# Patient Record
Sex: Male | Born: 1980 | Race: Black or African American | Hispanic: No | Marital: Single | Smoking: Current every day smoker
Health system: Southern US, Community
[De-identification: ages and names within clinical notes are randomized; demographics above are authoritative.]

## PROBLEM LIST (undated history)

## (undated) DIAGNOSIS — I10 Essential (primary) hypertension: Secondary | ICD-10-CM

---

## 2016-12-29 ENCOUNTER — Other Ambulatory Visit: Payer: Self-pay

## 2016-12-29 ENCOUNTER — Emergency Department (HOSPITAL_BASED_OUTPATIENT_CLINIC_OR_DEPARTMENT_OTHER)
Admission: EM | Admit: 2016-12-29 | Discharge: 2016-12-29 | Disposition: A | Discharge status: Home / Self Care | Payer: Self-pay | Attending: Emergency Medicine | Admitting: Emergency Medicine

## 2016-12-29 ENCOUNTER — Encounter (HOSPITAL_BASED_OUTPATIENT_CLINIC_OR_DEPARTMENT_OTHER): Payer: Self-pay | Admitting: *Deleted

## 2016-12-29 ENCOUNTER — Emergency Department (HOSPITAL_BASED_OUTPATIENT_CLINIC_OR_DEPARTMENT_OTHER): Payer: Self-pay

## 2016-12-29 DIAGNOSIS — L84 Corns and callosities: Secondary | ICD-10-CM | POA: Insufficient documentation

## 2016-12-29 DIAGNOSIS — M79671 Pain in right foot: Secondary | ICD-10-CM

## 2016-12-29 NOTE — ED Triage Notes (Signed)
pt c/o planter right foot pain x 1 month

## 2016-12-29 NOTE — Discharge Instructions (Signed)
Take Tylenol 1000 mg 4 times a day as needed for 1 week. This is the maximum dose of Tylenol (acetaminophen) usually take from all sources. Please check other over-the-counter medications and prescriptions to ensure you are not taking other medications that contain acetaminophen.  You may also take ibuprofen 400 mg 6 times a day alternating with or at the same time as tylenol.  °

## 2016-12-30 NOTE — ED Provider Notes (Signed)
[facility] EMERGENCY DEPARTMENT Provider Note   CSN: [id number] Arrival date & time: 12/29/16  1417     History   Chief Complaint Chief Complaint  Patient presents with  . Foot Pain    HPI Hyacinth Meekerntonio Huxford is a 36 y.o. male.  HPI   Presents with right foot pain for one month. Is worse when wearing work boots. Worsening over last month. Hurts by big toe MTP and small toe.  Pain present in morning but also worsens througtout day.  Has not tried anything yet for pain.  No hx of DM. No fevers, no trauma.  History reviewed. No pertinent past medical history.  There are no active problems to display for this patient.   History reviewed. No pertinent surgical history.     Home Medications    Prior to Admission medications   Not on File    Family History No family history on file.  Social History Social History   Tobacco Use  . Smoking status: Not on file  Substance Use Topics  . Alcohol use: Not on file  . Drug use: Not on file     Allergies   Patient has no known allergies.   Review of Systems Review of Systems  Constitutional: Negative for fever.  Respiratory: Negative for cough.   Cardiovascular: Negative for chest pain.  Musculoskeletal: Positive for arthralgias.  Skin: Negative for rash and wound.     Physical Exam Updated Vital Signs BP 140/88 (BP Location: Left Arm)   Pulse 67   Temp 98 F (36.7 C)   Resp 16   Ht 5\' 9"  (1.753 m)   Wt 127 kg (280 lb)   SpO2 99%   BMI 41.35 kg/m   Physical Exam  Constitutional: He is oriented to person, place, and time. He appears well-developed and well-nourished. No distress.  HENT:  Head: Normocephalic and atraumatic.  Eyes: Conjunctivae and EOM are normal.  Neck: Normal range of motion.  Cardiovascular: Normal rate, regular rhythm and intact distal pulses.  Pulmonary/Chest: Effort normal. No respiratory distress.  Musculoskeletal: He exhibits no edema.  Tenderness and callus present  1st MTP and 5th MTP/pinky toe  Neurological: He is alert and oriented to person, place, and time.  Skin: Skin is warm and dry. He is not diaphoretic.  Nursing note and vitals reviewed.    ED Treatments / Results  Labs (all labs ordered are listed, but only abnormal results are displayed) Labs Reviewed - No data to display  EKG  EKG Interpretation None       Radiology Dg Foot Complete Right  Result Date: 12/29/2016 CLINICAL DATA:  36 year old male with right foot pain along the plantar and lateral aspects for 1 month. No known injury. EXAM: RIGHT FOOT COMPLETE - 3+ VIEW COMPARISON:  None. FINDINGS: There is no evidence of fracture or dislocation. There is mild calcaneal spurring. There is no evidence of arthropathy or other focal bone abnormality. Soft tissues are unremarkable. IMPRESSION: Mild calcaneal spurring without acute osseous abnormalities. Electronically Signed   By: Sande BrothersSerena  Chacko M.D.   On: 12/29/2016 15:22    Procedures Procedures (including critical care time)  Medications Ordered in ED Medications - No data to display   Initial Impression / Assessment and Plan / ED Course  I have reviewed the triage vital signs and the nursing notes.  Pertinent labs & imaging results that were available during my care of the patient were reviewed by me and considered in my medical decision making (  see chart for details).     36yo male presents with concern for right foot pain worsening over the last month. No sign of fracture on XR.  Exam consistent with callus on pinky toe, bunion formation 1st MTP. No sign of abscess, no cellulitis.  Recommend podiatry follow up, ibuprofen, tylenol, supportive inserts. Patient discharged in stable condition with understanding of reasons to return.   Final Clinical Impressions(s) / ED Diagnoses   Final diagnoses:  Right foot pain  Callus of foot, small toe and bunion at MTP    ED Discharge Orders    None       Alvira MondaySchlossman, Channie Bostick,  MD 12/30/16 1405

## 2017-05-09 ENCOUNTER — Emergency Department (HOSPITAL_BASED_OUTPATIENT_CLINIC_OR_DEPARTMENT_OTHER)
Admission: EM | Admit: 2017-05-09 | Discharge: 2017-05-09 | Disposition: A | Discharge status: Home / Self Care | Payer: Self-pay | Attending: Emergency Medicine | Admitting: Emergency Medicine

## 2017-05-09 ENCOUNTER — Encounter (HOSPITAL_BASED_OUTPATIENT_CLINIC_OR_DEPARTMENT_OTHER): Payer: Self-pay

## 2017-05-09 ENCOUNTER — Other Ambulatory Visit: Payer: Self-pay

## 2017-05-09 DIAGNOSIS — J029 Acute pharyngitis, unspecified: Secondary | ICD-10-CM | POA: Insufficient documentation

## 2017-05-09 DIAGNOSIS — B9789 Other viral agents as the cause of diseases classified elsewhere: Secondary | ICD-10-CM | POA: Insufficient documentation

## 2017-05-09 DIAGNOSIS — F17200 Nicotine dependence, unspecified, uncomplicated: Secondary | ICD-10-CM | POA: Insufficient documentation

## 2017-05-09 LAB — RAPID STREP SCREEN (MED CTR MEBANE ONLY): Streptococcus, Group A Screen (Direct): NEGATIVE

## 2017-05-09 MED ORDER — ACETAMINOPHEN 500 MG PO TABS: 500.0000 mg | ORAL_TABLET | Freq: Four times a day (QID) | ORAL | 0 refills | Status: DC | PRN

## 2017-05-09 MED ORDER — DEXAMETHASONE 6 MG PO TABS
10.0000 mg | ORAL_TABLET | Freq: Once | ORAL | Status: AC
  Administered 2017-05-09: 18:00:00 10 mg via ORAL
  Filled 2017-05-09: qty 1

## 2017-05-09 MED ORDER — IBUPROFEN 600 MG PO TABS: 600.0000 mg | ORAL_TABLET | Freq: Four times a day (QID) | ORAL | 0 refills | Status: DC | PRN

## 2017-05-09 NOTE — ED Notes (Signed)
Pt is here with another patient with the same symptoms

## 2017-05-09 NOTE — Discharge Instructions (Signed)
Alternate ibuprofen and Tylenol every 3 hours or choose one type every 6 hours.  Gargle with warm salt water 3-4 times daily.  Make sure to drink plenty of water and get plenty of rest.  Please return to the emergency department if you develop any new or worsening symptoms.  I advised to follow-up and establish care with a primary care provider to have your blood pressure retaken, as it was a little high today- 152/92.

## 2017-05-09 NOTE — ED Provider Notes (Signed)
[facility] EMERGENCY DEPARTMENT Provider Note   CSN: [id number] Arrival date & time: 05/09/17  1655     History   Chief Complaint Chief Complaint  Patient presents with  . Cough    HPI Johnathan Larson is a 37 y.o. male who presents with a 1 day history of sore throat.  He reports it hurts to swallow water.  Patient has had a couple episodes of spitting out blood streaked sputum when he didn't want to swallow his saliva.  He denies cough or vomiting.  He denies fevers.  He has had some mild nasal congestion.  He denies ear pain.  He has had 1 day of intermittent diarrhea, nonbloody.  He is not taking any medications at home for symptoms.  He is here with another patient with similar symptoms.  HPI  History reviewed. No pertinent past medical history.  There are no active problems to display for this patient.   History reviewed. No pertinent surgical history.     Home Medications    Prior to Admission medications   Medication Sig Start Date End Date Taking? Authorizing Provider  acetaminophen (TYLENOL) 500 MG tablet Take 1 tablet (500 mg total) by mouth every 6 (six) hours as needed. 05/09/17   Elijio Staples, Waylan Boga, PA-C  ibuprofen (ADVIL,MOTRIN) 600 MG tablet Take 1 tablet (600 mg total) by mouth every 6 (six) hours as needed. 05/09/17   Emi Holes, PA-C    Family History No family history on file.  Social History Social History   Tobacco Use  . Smoking status: Current Every Day Smoker  . Smokeless tobacco: Never Used  Substance Use Topics  . Alcohol use: Yes    Comment: weekly  . Drug use: No     Allergies   Patient has no known allergies.   Review of Systems Review of Systems  Constitutional: Negative for fever.  HENT: Positive for congestion and sore throat.   Respiratory: Negative for cough and shortness of breath.   Cardiovascular: Negative for chest pain.  Gastrointestinal: Positive for diarrhea.     Physical Exam Updated Vital  Signs BP (!) 152/92 (BP Location: Right Arm)   Pulse 63   Temp 98.5 F (36.9 C) (Oral)   Resp 18   Ht 5\' 8"  (1.727 m)   Wt 128.4 kg (283 lb)   SpO2 100%   BMI 43.03 kg/m   Physical Exam  Constitutional: He appears well-developed and well-nourished. No distress.  HENT:  Head: Normocephalic and atraumatic.  Mouth/Throat: No trismus in the jaw. Posterior oropharyngeal edema and posterior oropharyngeal erythema present. No oropharyngeal exudate or tonsillar abscesses. Tonsils are 3+ on the right. Tonsils are 3+ on the left. Tonsillar exudate.  Eyes: Conjunctivae are normal. Pupils are equal, round, and reactive to light. Right eye exhibits no discharge. Left eye exhibits no discharge. No scleral icterus.  Neck: Normal range of motion. Neck supple. No thyromegaly present.  Cardiovascular: Normal rate, regular rhythm, normal heart sounds and intact distal pulses. Exam reveals no gallop and no friction rub.  No murmur heard. Pulmonary/Chest: Effort normal and breath sounds normal. No stridor. No respiratory distress. He has no wheezes. He has no rales.  Abdominal: Soft. Bowel sounds are normal. He exhibits no distension. There is no tenderness. There is no rebound and no guarding.  Musculoskeletal: He exhibits no edema.  Lymphadenopathy:    He has no cervical adenopathy.  Neurological: He is alert. Coordination normal.  Skin: Skin is warm and dry. No  rash noted. He is not diaphoretic. No pallor.  Psychiatric: He has a normal mood and affect.  Nursing note and vitals reviewed.    ED Treatments / Results  Labs (all labs ordered are listed, but only abnormal results are displayed) Labs Reviewed  RAPID STREP [facility]CREEN (NOT [facility])  CULTURE, GROUP A STREP [facility])    EKG  EKG Interpretation None       Radiology No results found.  Procedures Procedures (including critical care time)  Medications Ordered in ED Medications  dexamethasone (DECADRON) tablet 10 mg (10 mg Oral Given  05/09/17 1758)     Initial Impression / Assessment and Plan / ED Course  I have reviewed the triage vital signs and the nursing notes.  Pertinent labs & imaging results that were available during my care of the patient were reviewed by me and considered in my medical decision making (see chart for details).     Pt with negative strep. Diagnosis of viral pharyngitis. No abx indicated at this time.  Suspect patient may have had throat irritation which does not have blood-tinged saliva.  Patient denies any cough or vomiting, so no hematemesis or hemoptysis.  Patient given single dose of Decadron in the ED prior to discharge.  Discussed that results of strep culture are pending and patient will be informed if positive result and abx will be called in at that time. Discharge with symptomatic tx. No evidence of dehydration. Pt is tolerating secretions. Presentation not concerning for peritonsillar abscess or spread of infection to deep spaces of the throat; patent airway. Specific return precautions discussed. Recommended PCP follow up and recheck her blood pressure.  Patient understands and agrees with plan.  Patient vitals stable and discharged in satisfactory condition. I discussed patient case with Dr. Rush Landmarkegeler who guided the patient's management and agrees with plan.    Final Clinical Impressions(s) / ED Diagnoses   Final diagnoses:  Viral pharyngitis    ED Discharge Orders        Ordered    ibuprofen (ADVIL,MOTRIN) 600 MG tablet  Every 6 hours PRN     05/09/17 1820    acetaminophen (TYLENOL) 500 MG tablet  Every 6 hours PRN     05/09/17 1820       Emi HolesLaw, Ryot Burrous M, PA-C 05/09/17 1838    Tegeler, Canary Brimhristopher J, MD 05/10/17 [phone]

## 2017-05-09 NOTE — ED Triage Notes (Signed)
C/o flu like sx x today-NAD-steady gait 

## 2017-05-09 NOTE — ED Notes (Signed)
Pt verbalizes understanding of d/c instructions and denies any further needs at this time. 

## 2017-05-12 LAB — CULTURE, GROUP A STREP (THRC)

## 2017-05-18 ENCOUNTER — Other Ambulatory Visit: Payer: Self-pay

## 2017-05-18 ENCOUNTER — Encounter (HOSPITAL_BASED_OUTPATIENT_CLINIC_OR_DEPARTMENT_OTHER): Payer: Self-pay

## 2017-05-18 ENCOUNTER — Emergency Department (HOSPITAL_BASED_OUTPATIENT_CLINIC_OR_DEPARTMENT_OTHER)
Admission: EM | Admit: 2017-05-18 | Discharge: 2017-05-18 | Disposition: A | Discharge status: Home / Self Care | Payer: Self-pay | Attending: Emergency Medicine | Admitting: Emergency Medicine

## 2017-05-18 DIAGNOSIS — Z79899 Other long term (current) drug therapy: Secondary | ICD-10-CM | POA: Insufficient documentation

## 2017-05-18 DIAGNOSIS — E876 Hypokalemia: Secondary | ICD-10-CM | POA: Insufficient documentation

## 2017-05-18 DIAGNOSIS — R42 Dizziness and giddiness: Secondary | ICD-10-CM | POA: Insufficient documentation

## 2017-05-18 DIAGNOSIS — F17210 Nicotine dependence, cigarettes, uncomplicated: Secondary | ICD-10-CM | POA: Insufficient documentation

## 2017-05-18 DIAGNOSIS — I10 Essential (primary) hypertension: Secondary | ICD-10-CM | POA: Insufficient documentation

## 2017-05-18 HISTORY — DX: Essential (primary) hypertension: I10

## 2017-05-18 LAB — COMPREHENSIVE METABOLIC PANEL
ALK PHOS: 68 U/L (ref 38–126)
ALT: 27 U/L (ref 17–63)
AST: 27 U/L (ref 15–41)
Albumin: 3.9 g/dL (ref 3.5–5.0)
Anion gap: 9 (ref 5–15)
BUN: 14 mg/dL (ref 6–20)
CALCIUM: 8.8 mg/dL — AB (ref 8.9–10.3)
CO2: 23 mmol/L (ref 22–32)
CREATININE: 1.03 mg/dL (ref 0.61–1.24)
Chloride: 104 mmol/L (ref 101–111)
GFR calc Af Amer: >60 mL/min (ref >60)
Glucose, Bld: 102 mg/dL — ABNORMAL HIGH (ref 65–99)
Potassium: 3.1 mmol/L — ABNORMAL LOW (ref 3.5–5.1)
Sodium: 136 mmol/L (ref 135–145)
TOTAL PROTEIN: 7.2 g/dL (ref 6.5–8.1)
Total Bilirubin: 0.6 mg/dL (ref 0.3–1.2)

## 2017-05-18 LAB — CBC WITH DIFFERENTIAL/PLATELET
Basophils Absolute: 0 10*3/uL (ref 0.0–0.1)
Basophils Relative: 0 %
EOS ABS: 0.2 10*3/uL (ref 0.0–0.7)
EOS PCT: 2 %
HCT: 44.1 % (ref 39.0–52.0)
Hemoglobin: 15.3 g/dL (ref 13.0–17.0)
LYMPHS ABS: 1.9 10*3/uL (ref 0.7–4.0)
Lymphocytes Relative: 25 %
MCH: 32.4 pg (ref 26.0–34.0)
MCHC: 34.7 g/dL (ref 30.0–36.0)
MCV: 93.4 fL (ref 78.0–100.0)
Monocytes Absolute: 1.1 10*3/uL — ABNORMAL HIGH (ref 0.1–1.0)
Monocytes Relative: 14 %
Neutro Abs: 4.5 10*3/uL (ref 1.7–7.7)
Neutrophils Relative %: 59 %
PLATELETS: 179 10*3/uL (ref 150–400)
RBC: 4.72 MIL/uL (ref 4.22–5.81)
RDW: 13.3 % (ref 11.5–15.5)
WBC: 7.7 10*3/uL (ref 4.0–10.5)

## 2017-05-18 LAB — TROPONIN I

## 2017-05-18 MED ORDER — POTASSIUM CHLORIDE CRYS ER 20 MEQ PO TBCR: 20.0000 meq | EXTENDED_RELEASE_TABLET | Freq: Two times a day (BID) | ORAL | 0 refills | Status: DC

## 2017-05-18 NOTE — ED Notes (Signed)
ED Provider at bedside. 

## 2017-05-18 NOTE — Discharge Instructions (Signed)
As we discussed, we believe her symptoms are caused today by mild volume depletion, or mild dehydration, without any evidence of damage to your body.  Please drink plenty of clear fluids such as water and/or Gatorade and follow up with your regular doctor or the doctors listed in his documentation at the next available opportunity.  Return to the emergency department with any new or worsening symptoms that concern you, including but not limited to fever, shortness of breath, chest pain, or other concerning symptoms. ° ° °Dehydration, Adult °Dehydration is when you lose more fluids from the body than you take in. Vital organs like the kidneys, brain, and heart cannot function without a proper amount of fluids and salt. Any loss of fluids from the body can cause dehydration.  °CAUSES  °Vomiting. °Diarrhea. °Excessive sweating. °Excessive urine output. °Fever. °SYMPTOMS  °Mild dehydration °Thirst. °Dry lips. °Slightly dry mouth. °Moderate dehydration °Very dry mouth. °Sunken eyes. °Skin does not bounce back quickly when lightly pinched and released. °Dark urine and decreased urine production. °Decreased tear production. °Headache. °Severe dehydration °Very dry mouth. °Extreme thirst. °Rapid, weak pulse (more than 100 beats per minute at rest). °Cold hands and feet. °Not able to sweat in spite of heat and temperature. °Rapid breathing. °Blue lips. °Confusion and lethargy. °Difficulty being awakened. °Minimal urine production. °No tears. °DIAGNOSIS  °Your caregiver will diagnose dehydration based on your symptoms and your exam. Blood and urine tests will help confirm the diagnosis. The diagnostic evaluation should also identify the cause of dehydration. °TREATMENT  °Treatment of mild or moderate dehydration can often be done at home by increasing the amount of fluids that you drink. It is best to drink small amounts of fluid more often. Drinking too much at one time can make vomiting worse. Refer to the home care  instructions below. °Severe dehydration needs to be treated at the hospital where you will probably be given intravenous (IV) fluids that contain water and electrolytes. °HOME CARE INSTRUCTIONS  °Ask your caregiver about specific rehydration instructions. °Drink enough fluids to keep your urine clear or pale yellow. °Drink small amounts frequently if you have nausea and vomiting. °Eat as you normally do. °Avoid: °Foods or drinks high in sugar. °Carbonated drinks. °Juice. °Extremely hot or cold fluids. °Drinks with caffeine. °Fatty, greasy foods. °Alcohol. °Tobacco. °Overeating. °Gelatin desserts. °Wash your hands well to avoid spreading bacteria and viruses. °Only take over-the-counter or prescription medicines for pain, discomfort, or fever as directed by your caregiver. °Ask your caregiver if you should continue all prescribed and over-the-counter medicines. °Keep all follow-up appointments with your caregiver. °SEEK MEDICAL CARE IF: °You have abdominal pain and it increases or stays in one area (localizes). °You have a rash, stiff neck, or severe headache. °You are irritable, sleepy, or difficult to awaken. °You are weak, dizzy, or extremely thirsty. °SEEK IMMEDIATE MEDICAL CARE IF:  °You are unable to keep fluids down or you get worse despite treatment. °You have frequent episodes of vomiting or diarrhea. °You have blood or green matter (bile) in your vomit. °You have blood in your stool or your stool looks black and tarry. °You have not urinated in 6 to 8 hours, or you have only urinated a small amount of very dark urine. °You have a fever. °You faint. °MAKE SURE YOU:  °Understand these instructions. °Will watch your condition. °Will get help right away if you are not doing well or get worse. °Document Released: 02/07/2005 Document Revised: 05/02/2011 Document Reviewed: 09/27/2010 °[employer]® Patient Information ©2015   [employer]. This information is not intended to replace advice given to you by your health  care provider. Make sure you discuss any questions you have with your health care provider. ° °Rehydration, Adult °Rehydration is the replacement of body fluids lost during dehydration. Dehydration is an extreme loss of body fluids to the point of body function impairment. There are many ways extreme fluid loss can occur, including vomiting, diarrhea, or excess sweating. Recovering from dehydration requires replacing lost fluids, continuing to eat to maintain strength, and avoiding foods and beverages that may contribute to further fluid loss or may increase nausea. °HOW TO REHYDRATE °In most cases, rehydration involves the replacement of not only fluids but also carbohydrates and basic body salts. Rehydration with an oral rehydration solution is one way to replace essential nutrients lost through dehydration. °An oral rehydration solution can be purchased at pharmacies, retail stores, and online. Premixed packets of powder that you combine with water to make a solution are also sold. You can prepare an oral rehydration solution at home by mixing the following ingredients together:  ° - tsp table salt. °¾ tsp baking soda. ° tsp salt substitute containing potassium chloride. °1 tablespoons sugar. °1 L (34 oz) of water. °Be sure to use exact measurements. Including too much sugar can make diarrhea worse. °Drink ½-1 cup (120-240 mL) of oral rehydration solution each time you have diarrhea or vomit. If drinking this amount makes your vomiting worse, try drinking smaller amounts more often. For example, drink 1-3 tsp every 5-10 minutes.  °A general rule for staying hydrated is to drink 1½-2 L of fluid per day. Talk to your caregiver about the specific amount you should be drinking each day. Drink enough fluids to keep your urine clear or pale yellow. °EATING WHEN DEHYDRATED °Even if you have had severe sweating or you are having diarrhea, do not stop eating. Many healthy items in a normal diet are okay to continue eating  while recovering from dehydration. The following tips can help you to lessen nausea when you eat: °Ask someone else to prepare your food. Cooking smells may worsen nausea. °Eat in a well-ventilated room away from cooking smells. °Sit up when you eat. Avoid lying down until 1-2 hours after eating. °Eat small amounts when you eat. °Eat foods that are easy to digest. These include soft, well-cooked, or mashed foods. °FOODS AND BEVERAGES TO AVOID °Avoid eating or drinking the following foods and beverages that may increase nausea or further loss of fluid:  °Fruit juices with a high sugar content, such as concentrated juices. °Alcohol. °Beverages containing caffeine. °Carbonated drinks. They may cause a lot of gas. °Foods that may cause a lot of gas, such as cabbage, broccoli, and beans. °Fatty, greasy, and fried foods. °Spicy, very salty, and very sweet foods or drinks. °Foods or drinks that are very hot or very cold. Consume food or drinks at or near room temperature. °Foods that need a lot of chewing, such as raw vegetables. °Foods that are sticky or hard to swallow, such as peanut butter. °Document Released: 05/02/2011 Document Revised: 11/02/2011 Document Reviewed: 05/02/2011 °ExitCare® Patient Information ©2015 [employer]. This information is not intended to replace advice given to you by your health care provider. Make sure you discuss any questions you have with your health care provider. ° ° ° °

## 2017-05-18 NOTE — ED Provider Notes (Signed)
Emergency Department Provider Note   I have reviewed the triage vital signs and the nursing notes.   HISTORY  Chief Complaint Dizziness   HPI Johnathan Larson is a 37 y.o. male with PMH of HTN presents to the emergency department for evaluation of lightheadedness with standing.  Symptoms began at work today.  Patient states that he frequently feels lightheaded when standing up.  He does not drink very much water.  He often drinks fruit drinks or sodas.  Denies any chest pain, palpitations, dyspnea.  No falls or head trauma.  No fevers or chills.  No vomiting or diarrhea.  No weakness/numbness. No radiation of symptoms or modifying factors.   Past Medical History:  Diagnosis Date  . Hypertension     There are no active problems to display for this patient.   History reviewed. No pertinent surgical history.  Current Outpatient Rx  . Order #: [id number] Class: Print  . Order #: [id number] Class: Print  . Order #: [id number] Class: Print    Allergies Patient has no known allergies.  No family history on file.  Social History Social History   Tobacco Use  . Smoking status: Current Every Day Smoker    Types: Cigarettes  . Smokeless tobacco: Never Used  Substance Use Topics  . Alcohol use: Yes    Comment: weekly  . Drug use: No    Review of Systems  Constitutional: No fever/chills Eyes: No visual changes. ENT: No sore throat. Cardiovascular: Denies chest pain. Positive lightheadedness.  Respiratory: Denies shortness of breath. Gastrointestinal: No abdominal pain.  No nausea, no vomiting.  No diarrhea.  No constipation. Genitourinary: Negative for dysuria. Musculoskeletal: Negative for back pain. Skin: Negative for rash. Neurological: Negative for headaches, focal weakness or numbness.  10-point ROS otherwise negative.  ____________________________________________   PHYSICAL EXAM:  VITAL SIGNS: ED Triage Vitals  Enc Vitals Group     BP 05/18/17 2059 (!)  148/94     Pulse Rate 05/18/17 2059 83     Resp 05/18/17 2059 20     Temp 05/18/17 2059 99.9 F (37.7 C)     Temp Source 05/18/17 2059 Oral     SpO2 05/18/17 2059 99 %     Weight 05/18/17 2055 287 lb 7.7 oz (130.4 kg)     Height 05/18/17 2055 5\' 8"  (1.727 m)     Pain Score 05/18/17 2058 0   Constitutional: Alert and oriented. Well appearing and in no acute distress. Eyes: Conjunctivae are normal.  Head: Atraumatic. Nose: No congestion/rhinnorhea. Mouth/Throat: Mucous membranes are moist.  Neck: No stridor.   Cardiovascular: Normal rate, regular rhythm. Good peripheral circulation. Grossly normal heart sounds.   Respiratory: Normal respiratory effort.  No retractions. Lungs CTAB. Gastrointestinal: Soft and nontender. No distention.  Musculoskeletal: No lower extremity tenderness nor edema. No gross deformities of extremities. Neurologic:  Normal speech and language. No gross focal neurologic deficits are appreciated. CN exam 2-12 normal. Normal finger-to-nose testing.  Skin:  Skin is warm, dry and intact. No rash noted.  ____________________________________________   LABS (all labs ordered are listed, but only abnormal results are displayed)  Labs Reviewed  COMPREHENSIVE METABOLIC PANEL - Abnormal; Notable for the following components:      Result Value   Potassium 3.1 (*)    Glucose, Bld 102 (*)    Calcium 8.8 (*)    All other components within normal limits  CBC WITH DIFFERENTIAL/PLATELET - Abnormal; Notable for the following components:   Monocytes Absolute 1.1 (*)  All other components within normal limits  TROPONIN I   ____________________________________________  EKG   EKG Interpretation  Date/Time:  Thursday May 18 2017 20:59:05 EDT Ventricular Rate:  87 PR Interval:  166 QRS Duration: 82 QT Interval:  360 QTC Calculation: 433 R Axis:   70 Text Interpretation:  Normal sinus rhythm Normal ECG No STEMI.  Confirmed by Alona Bene [phone]) on 05/18/2017  9:52:48 PM Also confirmed by Alona Bene [phone]), editor Elita Quick ([id number])  on 05/19/2017 6:45:10 AM       ____________________________________________  RADIOLOGY  None ____________________________________________   PROCEDURES  Procedure(s) performed:   Procedures  None ____________________________________________   INITIAL IMPRESSION / ASSESSMENT AND PLAN / ED COURSE  Pertinent labs & imaging results that were available during my care of the patient were reviewed by me and considered in my medical decision making (see chart for details).  Patient presents to the ED with lightheadedness upon standing. Normal orthostatic vital signs. No CP or palpitations. No neuro deficits. Labs reviewed with mild hypokalemia. Will replete this over the coming days. Advised increased water intake. Patient will require PCP and possibly Cardiology follow up. Discussed this with the patient in detail.   At this time, I do not feel there is any life-threatening condition present. I have reviewed and discussed all results (EKG, imaging, lab, urine as appropriate), exam findings with patient. I have reviewed nursing notes and appropriate previous records.  I feel the patient is safe to be discharged home without further emergent workup. Discussed usual and customary return precautions. Patient and family (if present) verbalize understanding and are comfortable with this plan.  Patient will follow-up with their primary care provider. If they do not have a primary care provider, information for follow-up has been provided to them. All questions have been answered.    ____________________________________________  FINAL CLINICAL IMPRESSION(S) / ED DIAGNOSES  Final diagnoses:  Lightheadedness  Hypokalemia     MEDICATIONS GIVEN DURING THIS VISIT:  Medications - No data to display   NEW OUTPATIENT MEDICATIONS STARTED DURING THIS VISIT:  Discharge Medication List as of 05/18/2017 11:08  PM    START taking these medications   Details  potassium chloride SA (K-DUR,KLOR-CON) 20 MEQ tablet Take 1 tablet (20 mEq total) by mouth 2 (two) times daily for 4 days., Starting Thu 05/18/2017, Until Mon 05/22/2017, Print        Note:  This document was prepared using Dragon voice recognition software and may include unintentional dictation errors.  Alona Bene, MD Emergency Medicine    Jazma Pickel, Arlyss Repress, MD 05/19/17 [phone]

## 2017-05-18 NOTE — ED Triage Notes (Signed)
C/o woke with dizziness 7am today-worse throughout the day with feeling hot-NA-steady gait

## 2017-05-31 ENCOUNTER — Encounter (HOSPITAL_BASED_OUTPATIENT_CLINIC_OR_DEPARTMENT_OTHER): Payer: Self-pay | Admitting: *Deleted

## 2017-05-31 ENCOUNTER — Other Ambulatory Visit: Payer: Self-pay

## 2017-05-31 DIAGNOSIS — J302 Other seasonal allergic rhinitis: Secondary | ICD-10-CM | POA: Insufficient documentation

## 2017-05-31 DIAGNOSIS — F17200 Nicotine dependence, unspecified, uncomplicated: Secondary | ICD-10-CM | POA: Insufficient documentation

## 2017-05-31 DIAGNOSIS — H1013 Acute atopic conjunctivitis, bilateral: Secondary | ICD-10-CM | POA: Insufficient documentation

## 2017-05-31 DIAGNOSIS — I10 Essential (primary) hypertension: Secondary | ICD-10-CM | POA: Insufficient documentation

## 2017-05-31 NOTE — ED Triage Notes (Addendum)
Pt c/o bil eye redness swelling, itching  and  Drainage,  x 2 days

## 2017-06-01 ENCOUNTER — Emergency Department (HOSPITAL_BASED_OUTPATIENT_CLINIC_OR_DEPARTMENT_OTHER)
Admission: EM | Admit: 2017-06-01 | Discharge: 2017-06-01 | Disposition: A | Discharge status: Home / Self Care | Payer: Self-pay | Attending: Emergency Medicine | Admitting: Emergency Medicine

## 2017-06-01 DIAGNOSIS — J302 Other seasonal allergic rhinitis: Secondary | ICD-10-CM

## 2017-06-01 DIAGNOSIS — H1013 Acute atopic conjunctivitis, bilateral: Secondary | ICD-10-CM

## 2017-06-01 NOTE — ED Notes (Signed)
Pt discharged home during downtime - please see downtime documentation. 2 Rx's and d/c instructions given to patient and he verbalized understanding of all.

## 2017-06-01 NOTE — ED Provider Notes (Signed)
See Epic paper downtime documentation.   Johnathan Larson, Johnathan RuizJohn, MD 06/01/17 [phone]

## 2017-06-01 NOTE — ED Notes (Signed)
Assumed care of patient from [place], [place]. Pt resting quietly. No distress. No complaints. Awaiting EDP disposition.

## 2017-06-22 ENCOUNTER — Encounter (HOSPITAL_BASED_OUTPATIENT_CLINIC_OR_DEPARTMENT_OTHER): Payer: Self-pay | Admitting: *Deleted

## 2017-06-22 ENCOUNTER — Other Ambulatory Visit: Payer: Self-pay

## 2017-06-22 ENCOUNTER — Emergency Department (HOSPITAL_BASED_OUTPATIENT_CLINIC_OR_DEPARTMENT_OTHER)
Admission: EM | Admit: 2017-06-22 | Discharge: 2017-06-22 | Disposition: A | Discharge status: Home / Self Care | Payer: Self-pay | Attending: Emergency Medicine | Admitting: Emergency Medicine

## 2017-06-22 DIAGNOSIS — F17210 Nicotine dependence, cigarettes, uncomplicated: Secondary | ICD-10-CM | POA: Insufficient documentation

## 2017-06-22 DIAGNOSIS — I10 Essential (primary) hypertension: Secondary | ICD-10-CM | POA: Insufficient documentation

## 2017-06-22 DIAGNOSIS — Z79899 Other long term (current) drug therapy: Secondary | ICD-10-CM | POA: Insufficient documentation

## 2017-06-22 DIAGNOSIS — K0889 Other specified disorders of teeth and supporting structures: Secondary | ICD-10-CM | POA: Insufficient documentation

## 2017-06-22 MED ORDER — IBUPROFEN 600 MG PO TABS: 600.0000 mg | ORAL_TABLET | Freq: Three times a day (TID) | ORAL | 0 refills | Status: DC | PRN

## 2017-06-22 MED ORDER — PENICILLIN V POTASSIUM 500 MG PO TABS: 500.0000 mg | ORAL_TABLET | Freq: Three times a day (TID) | ORAL | 0 refills | Status: DC

## 2017-06-22 NOTE — ED Provider Notes (Signed)
[facility] EMERGENCY DEPARTMENT Provider Note   CSN: [id number] Arrival date & time: 06/22/17  1527     History   Chief Complaint Chief Complaint  Patient presents with  . Dental Pain    HPI Johnathan Larson is a 37 y.o. male.  HPI 37 year old male presents emergency department complaints of right upper dental pain over the past 24 hours.  No difficulty breathing or swallowing.  No fevers or chills.  No other complaints.  Pain is moderate in severity.      Past Medical History:  Diagnosis Date  . Hypertension     There are no active problems to display for this patient.   History reviewed. No pertinent surgical history.      Home Medications    Prior to Admission medications   Medication Sig Start Date End Date Taking? Authorizing Provider  acetaminophen (TYLENOL) 500 MG tablet Take 1 tablet (500 mg total) by mouth every 6 (six) hours as needed. 05/09/17   Law, Waylan Boga, PA-C  ibuprofen (ADVIL,MOTRIN) 600 MG tablet Take 1 tablet (600 mg total) by mouth every 8 (eight) hours as needed. 06/22/17   Azalia Bilis, MD  penicillin v potassium (VEETID) 500 MG tablet Take 1 tablet (500 mg total) by mouth 3 (three) times daily. 06/22/17   Azalia Bilis, MD  potassium chloride SA (K-DUR,KLOR-CON) 20 MEQ tablet Take 1 tablet (20 mEq total) by mouth 2 (two) times daily for 4 days. 05/18/17 05/22/17  Long, Arlyss Repress, MD    Family History No family history on file.  Social History Social History   Tobacco Use  . Smoking status: Current Every Day Smoker    Types: Cigarettes  . Smokeless tobacco: Never Used  Substance Use Topics  . Alcohol use: Yes    Comment: weekly  . Drug use: No     Allergies   Patient has no known allergies.   Review of Systems Review of Systems  All other systems reviewed and are negative.    Physical Exam Updated Vital Signs BP 134/90   Pulse 78   Temp 98.5 F (36.9 C) (Oral)   Resp 18   Ht  (1.727 m)   Wt 130.2 kg  (287 lb)   SpO2 99%   BMI 43.64 kg/m   Physical Exam  Constitutional: He is oriented to person, place, and time. He appears well-developed and well-nourished.  HENT:  Head: Normocephalic.  Right upper second molar dental tenderness without gingival swelling or fluctuance.  Tolerating secretions.  Oral airway patent.  Anterior neck normal.  Eyes: EOM are normal.  Neck: Normal range of motion.  Pulmonary/Chest: Effort normal.  Abdominal: He exhibits no distension.  Musculoskeletal: Normal range of motion.  Neurological: He is alert and oriented to person, place, and time.  Psychiatric: He has a normal mood and affect.  Nursing note and vitals reviewed.    ED Treatments / Results  Labs (all labs ordered are listed, but only abnormal results are displayed) Labs Reviewed - No data to display  EKG None  Radiology No results found.  Procedures Procedures (including critical care time)  Medications Ordered in ED Medications - No data to display   Initial Impression / Assessment and Plan / ED Course  I have reviewed the triage vital signs and the nursing notes.  Pertinent labs & imaging results that were available during my care of the patient were reviewed by me and considered in my medical decision making (see chart for details).  Dental Pain. Home with antibiotics and pain medicine. Recommend dental follow up. No signs of gingival abscess. Tolerating secretions. Airway patent. No sub lingular swelling   Final Clinical Impressions(s) / ED Diagnoses   Final diagnoses:  None    ED Discharge Orders        Ordered    penicillin v potassium (VEETID) 500 MG tablet  3 times daily     06/22/17 1602    ibuprofen (ADVIL,MOTRIN) 600 MG tablet  Every 8 hours PRN     06/22/17 1602       Azalia Bilis, MD 06/22/17 [phone]

## 2017-06-22 NOTE — Discharge Instructions (Addendum)
Call your dentist for follow up °

## 2017-06-22 NOTE — ED Triage Notes (Signed)
Dental pain since yesterday.  

## 2017-08-28 ENCOUNTER — Emergency Department (HOSPITAL_BASED_OUTPATIENT_CLINIC_OR_DEPARTMENT_OTHER)
Admission: EM | Admit: 2017-08-28 | Discharge: 2017-08-28 | Disposition: A | Discharge status: Home / Self Care | Payer: Self-pay | Attending: Emergency Medicine | Admitting: Emergency Medicine

## 2017-08-28 ENCOUNTER — Encounter (HOSPITAL_BASED_OUTPATIENT_CLINIC_OR_DEPARTMENT_OTHER): Payer: Self-pay | Admitting: *Deleted

## 2017-08-28 ENCOUNTER — Other Ambulatory Visit: Payer: Self-pay

## 2017-08-28 DIAGNOSIS — I10 Essential (primary) hypertension: Secondary | ICD-10-CM | POA: Insufficient documentation

## 2017-08-28 DIAGNOSIS — K029 Dental caries, unspecified: Secondary | ICD-10-CM | POA: Insufficient documentation

## 2017-08-28 DIAGNOSIS — F17210 Nicotine dependence, cigarettes, uncomplicated: Secondary | ICD-10-CM | POA: Insufficient documentation

## 2017-08-28 DIAGNOSIS — R030 Elevated blood-pressure reading, without diagnosis of hypertension: Secondary | ICD-10-CM

## 2017-08-28 MED ORDER — TRAMADOL HCL 50 MG PO TABS: 50.0000 mg | ORAL_TABLET | Freq: Four times a day (QID) | ORAL | 0 refills | Status: DC | PRN

## 2017-08-28 MED ORDER — AMOXICILLIN 500 MG PO CAPS: 500.0000 mg | ORAL_CAPSULE | Freq: Three times a day (TID) | ORAL | 0 refills | Status: DC

## 2017-08-28 MED ORDER — MELOXICAM 15 MG PO TABS: 15.0000 mg | ORAL_TABLET | Freq: Every day | ORAL | 0 refills | Status: DC

## 2017-08-28 MED FILL — MELOXICAM 15 MG TABLET: 15 | 10 days supply | Qty: 10 | Fill #0

## 2017-08-28 MED FILL — traMADol HCL 50 MG TABS: 50 | 3 days supply | Qty: 15 | Fill #0

## 2017-08-28 MED FILL — AMOXICILLIN 500 MG CAPSULE: 500 | 7 days supply | Qty: 21 | Fill #0

## 2017-08-28 NOTE — ED Provider Notes (Signed)
[facility] EMERGENCY DEPARTMENT Provider Note   CSN: [id number] Arrival date & time: 08/28/17  1544     History   Chief Complaint Chief Complaint  Patient presents with  . Dental Pain    HPI Johnathan Larson is a 37 y.o. male who presents the emergency department chief complaint of dental pain.  Patient states that it has been aching on and off over the past week however last night he developed pain in his right upper third molar which has been constant, throbbing and severe.  He took Tylenol without relief of his symptoms.  He denies difficulty swallowing, pain in the jaw or ear.  He has a broken tooth.  He is a daily smoker.  Patient denies fevers or chills  HPI  Past Medical History:  Diagnosis Date  . Hypertension     There are no active problems to display for this patient.   History reviewed. No pertinent surgical history.      Home Medications    Prior to Admission medications   Medication Sig Start Date End Date Taking? Authorizing Provider  acetaminophen (TYLENOL) 500 MG tablet Take 1 tablet (500 mg total) by mouth every 6 (six) hours as needed. 05/09/17   Law, Waylan Boga, PA-C  amoxicillin (AMOXIL) 500 MG capsule Take 1 capsule (500 mg total) by mouth 3 (three) times daily. 08/28/17   Arthor Captain, PA-C  ibuprofen (ADVIL,MOTRIN) 600 MG tablet Take 1 tablet (600 mg total) by mouth every 8 (eight) hours as needed. 06/22/17   Azalia Bilis, MD  meloxicam (MOBIC) 15 MG tablet Take 1 tablet (15 mg total) by mouth daily. Take 1 daily with food. 08/28/17   Arthor Captain, PA-C  penicillin v potassium (VEETID) 500 MG tablet Take 1 tablet (500 mg total) by mouth 3 (three) times daily. 06/22/17   Azalia Bilis, MD  potassium chloride SA (K-DUR,KLOR-CON) 20 MEQ tablet Take 1 tablet (20 mEq total) by mouth 2 (two) times daily for 4 days. 05/18/17 05/22/17  Long, Arlyss Repress, MD  traMADol (ULTRAM) 50 MG tablet Take 1 tablet (50 mg total) by mouth every 6 (six) hours as needed.  08/28/17   Arthor Captain, PA-C    Family History History reviewed. No pertinent family history.  Social History Social History   Tobacco Use  . Smoking status: Current Every Day Smoker    Types: Cigarettes  . Smokeless tobacco: Never Used  Substance Use Topics  . Alcohol use: Yes    Comment: weekly  . Drug use: No     Allergies   Patient has no known allergies.   Review of Systems Review of Systems  Ten systems reviewed and are negative for acute change, except as noted in the HPI.   Physical Exam Updated Vital Signs BP (!) 160/99   Pulse 86   Temp 98.5 F (36.9 C)   Resp 18   Ht 5\' 8"  (1.727 m)   Wt 124.7 kg (275 lb)   SpO2 100%   BMI 41.81 kg/m   Physical Exam  Physical Exam  Nursing note and vitals reviewed. Constitutional: He appears well-developed and well-nourished. No distress.  HENT:  Head: Normocephalic and atraumatic.  Eyes: Conjunctivae normal are normal. No scleral icterus.  Neck: Normal range of motion. Neck supple.  Mouth: Right upper third molar with old fracture of the tooth, exposed pulp and dental carry, no obvious abscess.  Tender to palpation Cardiovascular: Normal rate, regular rhythm and normal heart sounds.   Pulmonary/Chest: Effort normal  and breath sounds normal. No respiratory distress.  Abdominal: Soft. There is no tenderness.  Musculoskeletal: He exhibits no edema.  Neurological: He is alert.  Skin: Skin is warm and dry. He is not diaphoretic.  Psychiatric: His behavior is normal.    ED Treatments / Results  Labs (all labs ordered are listed, but only abnormal results are displayed) Labs Reviewed - No data to display  EKG None  Radiology No results found.  Procedures Procedures (including critical care time)  Medications Ordered in ED Medications - No data to display   Initial Impression / Assessment and Plan / ED Course  I have reviewed the triage vital signs and the nursing notes.  Pertinent labs & imaging  results that were available during my care of the patient were reviewed by me and considered in my medical decision making (see chart for details).    Patient with toothache.  No gross abscess.  Exam unconcerning for Ludwig's angina or spread of infection.  Will treat with penicillin and pain medicine.  Urged patient to follow-up with dentist.     Final Clinical Impressions(s) / ED Diagnoses   Final diagnoses:  Pain due to dental caries  Elevated blood pressure reading    ED Discharge Orders        Ordered    amoxicillin (AMOXIL) 500 MG capsule  3 times daily     08/28/17 1606    meloxicam (MOBIC) 15 MG tablet  Daily     08/28/17 1606    traMADol (ULTRAM) 50 MG tablet  Every 6 hours PRN     08/28/17 1606       Arthor CaptainHarris, Karsten Howry, PA-C 08/28/17 1609    Benjiman CorePickering, Nathan, MD 08/29/17 [phone]

## 2017-08-28 NOTE — Discharge Instructions (Signed)

## 2017-08-28 NOTE — ED Triage Notes (Signed)
C/o dental pain x 1 day

## 2017-11-22 ENCOUNTER — Encounter (HOSPITAL_BASED_OUTPATIENT_CLINIC_OR_DEPARTMENT_OTHER): Payer: Self-pay

## 2017-11-22 ENCOUNTER — Emergency Department (HOSPITAL_BASED_OUTPATIENT_CLINIC_OR_DEPARTMENT_OTHER)
Admission: EM | Admit: 2017-11-22 | Discharge: 2017-11-22 | Disposition: A | Discharge status: Home / Self Care | Payer: Self-pay | Attending: Emergency Medicine | Admitting: Emergency Medicine

## 2017-11-22 DIAGNOSIS — F17210 Nicotine dependence, cigarettes, uncomplicated: Secondary | ICD-10-CM | POA: Insufficient documentation

## 2017-11-22 DIAGNOSIS — K0889 Other specified disorders of teeth and supporting structures: Secondary | ICD-10-CM | POA: Insufficient documentation

## 2017-11-22 DIAGNOSIS — I10 Essential (primary) hypertension: Secondary | ICD-10-CM | POA: Insufficient documentation

## 2017-11-22 DIAGNOSIS — Z79899 Other long term (current) drug therapy: Secondary | ICD-10-CM | POA: Insufficient documentation

## 2017-11-22 MED ORDER — IBUPROFEN 800 MG PO TABS
800.0000 mg | ORAL_TABLET | Freq: Three times a day (TID) | ORAL | Status: DC
  Administered 2017-11-22: 800 mg via ORAL
  Filled 2017-11-22: qty 1

## 2017-11-22 MED ORDER — HYDROCODONE-ACETAMINOPHEN 5-325 MG PO TABS
1.0000 | ORAL_TABLET | Freq: Once | ORAL | Status: AC
  Administered 2017-11-22: 1 via ORAL
  Filled 2017-11-22: qty 1

## 2017-11-22 NOTE — ED Provider Notes (Signed)
[facility] EMERGENCY DEPARTMENT Provider Note   CSN: [id number] Arrival date & time: 11/22/17  1519     History   Chief Complaint Chief Complaint  Patient presents with  . Dental Pain    HPI Johnathan Larson is a 37 y.o. male.  37 y/o male with a PMH of HTN presents to the ED with a chief complaint of tooth pain x 2 days. Patient reports he was eating yesterday when he felt his back right tooth crack, he reports pain with mastication around the tooth area. Patient states he saw a dentist a year ago but has no primary dentist at this time. He reports taking aleve for the pain but states no relieve in symptoms. He denies any fever, difficulty swallowing or other complaints.      Past Medical History:  Diagnosis Date  . Hypertension     There are no active problems to display for this patient.   History reviewed. No pertinent surgical history.      Home Medications    Prior to Admission medications   Medication Sig Start Date End Date Taking? Authorizing Provider  acetaminophen (TYLENOL) 500 MG tablet Take 1 tablet (500 mg total) by mouth every 6 (six) hours as needed. 05/09/17   Law, Waylan Boga, PA-C  amoxicillin (AMOXIL) 500 MG capsule Take 1 capsule (500 mg total) by mouth 3 (three) times daily. 08/28/17   Arthor Captain, PA-C  ibuprofen (ADVIL,MOTRIN) 600 MG tablet Take 1 tablet (600 mg total) by mouth every 8 (eight) hours as needed. 06/22/17   Azalia Bilis, MD  meloxicam (MOBIC) 15 MG tablet Take 1 tablet (15 mg total) by mouth daily. Take 1 daily with food. 08/28/17   Arthor Captain, PA-C  penicillin v potassium (VEETID) 500 MG tablet Take 1 tablet (500 mg total) by mouth 3 (three) times daily. 06/22/17   Azalia Bilis, MD  potassium chloride SA (K-DUR,KLOR-CON) 20 MEQ tablet Take 1 tablet (20 mEq total) by mouth 2 (two) times daily for 4 days. 05/18/17 05/22/17  Long, Arlyss Repress, MD  traMADol (ULTRAM) 50 MG tablet Take 1 tablet (50 mg total) by mouth every 6 (six)  hours as needed. 08/28/17   Arthor Captain, PA-C    Family History No family history on file.  Social History Social History   Tobacco Use  . Smoking status: Current Every Day Smoker    Types: Cigarettes  . Smokeless tobacco: Never Used  Substance Use Topics  . Alcohol use: Yes    Comment: weekly  . Drug use: No     Allergies   Patient has no known allergies.   Review of Systems Review of Systems  Constitutional: Negative for fever.  HENT: Positive for dental problem.   All other systems reviewed and are negative.    Physical Exam Updated Vital Signs BP 137/85 (BP Location: Right Arm)   Pulse 71   Temp 98.2 F (36.8 C) (Oral)   Resp 18   SpO2 98%   Physical Exam  Constitutional: He is oriented to person, place, and time. He appears well-developed and well-nourished.  HENT:  Head: Normocephalic and atraumatic.  Mouth/Throat: Oropharynx is clear and moist and mucous membranes are normal. Abnormal dentition. Dental caries present. No dental abscesses.    Neck: Normal range of motion. Neck supple.  Cardiovascular: Normal heart sounds.  Pulmonary/Chest: Effort normal and breath sounds normal. He has no wheezes.  Abdominal: Soft. Bowel sounds are normal. There is no tenderness.  Musculoskeletal: He exhibits no  tenderness.  Lymphadenopathy:       Head (right side): No submandibular and no tonsillar adenopathy present.  Neurological: He is alert and oriented to person, place, and time.  Skin: Skin is warm and dry.  Nursing note and vitals reviewed.    ED Treatments / Results  Labs (all labs ordered are listed, but only abnormal results are displayed) Labs Reviewed - No data to display  EKG None  Radiology No results found.  Procedures Procedures (including critical care time)  Medications Ordered in ED Medications  HYDROcodone-acetaminophen (NORCO/VICODIN) 5-325 MG per tablet 1 tablet (has no administration in time range)  ibuprofen (ADVIL,MOTRIN)  tablet 800 mg (has no administration in time range)     Initial Impression / Assessment and Plan / ED Course  I have reviewed the triage vital signs and the nursing notes.  Pertinent labs & imaging results that were available during my care of the patient were reviewed by me and considered in my medical decision making (see chart for details).     Patient presents with dental pain which began after chipping his right tooth, he reports pain with mastication. He does not have a dental provider. He has tried aleve for his pain but states no relieve. Upon examination there is no abscess present, no erythema. I try patient with resources for dental care at this time.  I will also prescribe him some ibuprofen 800 mg that he can take 3 times a day for his pain.  Patient is requesting a work note I will provide this for patient.  He denies any fever, difficulty swallowing, decrease in intake.  Final Clinical Impressions(s) / ED Diagnoses   Final diagnoses:  Pain, dental    ED Discharge Orders    None       Claude Manges, PA-C 11/22/17 1736    Raeford Razor, MD 11/27/17 [phone]

## 2017-11-22 NOTE — Discharge Instructions (Addendum)
I have provided information with dental resources, please schedule an appointment at your earliest convenience. You may alternate ibuprofen or tylenol for your pain.

## 2017-11-22 NOTE — ED Notes (Signed)
ED Provider at bedside. 

## 2017-11-22 NOTE — ED Triage Notes (Signed)
Pt c/o rt upper wisdom tooth pain since last night

## 2017-12-11 ENCOUNTER — Encounter (HOSPITAL_BASED_OUTPATIENT_CLINIC_OR_DEPARTMENT_OTHER): Payer: Self-pay | Admitting: *Deleted

## 2017-12-11 ENCOUNTER — Other Ambulatory Visit: Payer: Self-pay

## 2017-12-11 ENCOUNTER — Emergency Department (HOSPITAL_BASED_OUTPATIENT_CLINIC_OR_DEPARTMENT_OTHER)
Admission: EM | Admit: 2017-12-11 | Discharge: 2017-12-11 | Disposition: A | Discharge status: Home / Self Care | Payer: Self-pay | Attending: Emergency Medicine | Admitting: Emergency Medicine

## 2017-12-11 DIAGNOSIS — K029 Dental caries, unspecified: Secondary | ICD-10-CM | POA: Insufficient documentation

## 2017-12-11 DIAGNOSIS — I10 Essential (primary) hypertension: Secondary | ICD-10-CM | POA: Insufficient documentation

## 2017-12-11 DIAGNOSIS — K0889 Other specified disorders of teeth and supporting structures: Secondary | ICD-10-CM | POA: Insufficient documentation

## 2017-12-11 DIAGNOSIS — F17210 Nicotine dependence, cigarettes, uncomplicated: Secondary | ICD-10-CM | POA: Insufficient documentation

## 2017-12-11 DIAGNOSIS — Z79899 Other long term (current) drug therapy: Secondary | ICD-10-CM | POA: Insufficient documentation

## 2017-12-11 MED ORDER — LIDOCAINE VISCOUS HCL 2 % MT SOLN: 15.0000 mL | OROMUCOSAL | 0 refills | Status: DC | PRN

## 2017-12-11 MED ORDER — PENICILLIN V POTASSIUM 250 MG PO TABS
500.0000 mg | ORAL_TABLET | Freq: Once | ORAL | Status: AC
  Administered 2017-12-11: 500 mg via ORAL
  Filled 2017-12-11: qty 2

## 2017-12-11 MED ORDER — ACETAMINOPHEN 500 MG PO TABS
1000.0000 mg | ORAL_TABLET | Freq: Once | ORAL | Status: AC
  Administered 2017-12-11: 1000 mg via ORAL
  Filled 2017-12-11: qty 2

## 2017-12-11 MED ORDER — CHLORHEXIDINE GLUCONATE 0.12 % MT SOLN: 15.0000 mL | Freq: Two times a day (BID) | OROMUCOSAL | 0 refills | Status: DC

## 2017-12-11 MED ORDER — PENICILLIN V POTASSIUM 500 MG PO TABS: 500.0000 mg | ORAL_TABLET | Freq: Four times a day (QID) | ORAL | 0 refills | Status: AC

## 2017-12-11 NOTE — ED Provider Notes (Signed)
[facility] EMERGENCY DEPARTMENT Provider Note   CSN: [id number] Arrival date & time: 12/11/17  1451     History   Chief Complaint Chief Complaint  Patient presents with  . Dental Pain    HPI Johnathan Larson is a 37 y.o. male.  HPI    Johnathan Larson is a 37 y.o. male who presents to the Emergency Department complaining of persistent, gradually worsening, right-sided, upper dental pain beginning yesterday after his right most posterior molar cracked while eating. Pt describes their pain as throbbing. Pt has been taking Aleve at home with minimal relief of pain. Pain is exacerbated by chewing. They are currently followed by dentistry, and thinks that he could get in next week.  Pt denies facial swelling, fever, chills, difficulty breathing, difficulty swallowing.   Past Medical History:  Diagnosis Date  . Hypertension     There are no active problems to display for this patient.   History reviewed. No pertinent surgical history.      Home Medications    Prior to Admission medications   Medication Sig Start Date End Date Taking? Authorizing Provider  acetaminophen (TYLENOL) 500 MG tablet Take 1 tablet (500 mg total) by mouth every 6 (six) hours as needed. 05/09/17   Law, Waylan Boga, PA-C  amoxicillin (AMOXIL) 500 MG capsule Take 1 capsule (500 mg total) by mouth 3 (three) times daily. 08/28/17   Arthor Captain, PA-C  ibuprofen (ADVIL,MOTRIN) 600 MG tablet Take 1 tablet (600 mg total) by mouth every 8 (eight) hours as needed. 06/22/17   Azalia Bilis, MD  meloxicam (MOBIC) 15 MG tablet Take 1 tablet (15 mg total) by mouth daily. Take 1 daily with food. 08/28/17   Arthor Captain, PA-C  penicillin v potassium (VEETID) 500 MG tablet Take 1 tablet (500 mg total) by mouth 3 (three) times daily. 06/22/17   Azalia Bilis, MD  potassium chloride SA (K-DUR,KLOR-CON) 20 MEQ tablet Take 1 tablet (20 mEq total) by mouth 2 (two) times daily for 4 days. 05/18/17 05/22/17  Long, Arlyss Repress, MD  traMADol (ULTRAM) 50 MG tablet Take 1 tablet (50 mg total) by mouth every 6 (six) hours as needed. 08/28/17   Arthor Captain, PA-C    Family History No family history on file.  Social History Social History   Tobacco Use  . Smoking status: Current Every Day Smoker    Types: Cigarettes  . Smokeless tobacco: Never Used  Substance Use Topics  . Alcohol use: Yes    Comment: weekly  . Drug use: No     Allergies   Patient has no known allergies.   Review of Systems Review of Systems  Constitutional: Negative for chills and fever.  HENT: Positive for dental problem. Negative for sore throat and trouble swallowing.   Respiratory: Negative for stridor.      Physical Exam Updated Vital Signs BP 123/85   Pulse 62   Temp 98.8 F (37.1 C) (Oral)   Resp 18   Ht 5\' 8"  (1.727 m)   Wt 124.7 kg   SpO2 98%   BMI 41.81 kg/m   Physical Exam  Constitutional: He appears well-developed and well-nourished. No distress.  Sitting comfortably in bed.  HENT:  Head: Normocephalic and atraumatic.  Mouth/Throat:    Dental cavities and poor oral dentition noted. Pain along tooth as depicted in image, of which the crown is entirely broken off. No abscess noted. Midline uvula. No trismus. OP clear and moist. No oropharyngeal erythema or edema. Neck  supple with no tenderness. No facial edema.  Eyes: Conjunctivae are normal. Right eye exhibits no discharge. Left eye exhibits no discharge.  EOMs normal to gross examination.  Neck: Normal range of motion.  Cardiovascular: Normal rate and regular rhythm.  Intact, 2+ radial pulse.  Pulmonary/Chest:  Normal respiratory effort. Patient converses comfortably. No audible wheeze or stridor.  Abdominal: He exhibits no distension.  Musculoskeletal: Normal range of motion.  Neurological: He is alert.  Cranial nerves intact to gross observation. Patient moves extremities without difficulty.  Skin: Skin is warm and dry. He is not diaphoretic.    Psychiatric: He has a normal mood and affect. His behavior is normal. Judgment and thought content normal.  Nursing note and vitals reviewed.    ED Treatments / Results  Labs (all labs ordered are listed, but only abnormal results are displayed) Labs Reviewed - No data to display  EKG None  Radiology No results found.  Procedures Procedures (including critical care time)  Medications Ordered in ED Medications  acetaminophen (TYLENOL) tablet 1,000 mg (has no administration in time range)  penicillin v potassium (VEETID) tablet 500 mg (has no administration in time range)     Initial Impression / Assessment and Plan / ED Course  I have reviewed the triage vital signs and the nursing notes.  Pertinent labs & imaging results that were available during my care of the patient were reviewed by me and considered in my medical decision making (see chart for details).     Johnathan Larson is a 37 y.o. male who presents to ED for dental pain.  Appears to be due to dental injury due to dental decay.  No abscess requiring immediate incision and drainage. Patient is afebrile, non toxic appearing, and swallowing secretions well. Exam not concerning for Ludwig's angina or pharyngeal abscess. Will treat with penicillin, viscous lidocaine, and chlorhexidine rinse.  Patient does have current dental care with previous dentist and has been in contact with this office. Patient voices understanding and is agreeable to plan.  Final Clinical Impressions(s) / ED Diagnoses   Final diagnoses:  Pain, dental    ED Discharge Orders         Ordered    penicillin v potassium (VEETID) 500 MG tablet  4 times daily     12/11/17 1802    chlorhexidine (PERIDEX) 0.12 % solution  2 times daily     12/11/17 1802    lidocaine (XYLOCAINE) 2 % solution  As needed     12/11/17 1802           Elisha Ponder, PA-C 12/11/17 1803    Alvira Monday, MD 12/12/17 1454

## 2017-12-11 NOTE — ED Triage Notes (Signed)
Toothache since yesterday 

## 2017-12-11 NOTE — Discharge Instructions (Signed)
Please see the information and instructions below regarding your visit.  Your diagnoses today include:  1. Pain, dental    You have a dental injury. It is very important that you get evaluated by a dentist as soon as possible. Call tomorrow to schedule an appointment. Ibuprofen as needed for pain. Take your full course of antibiotics. Read the instructions below.  Tests performed today include: See side panel of your discharge paperwork for testing performed today. Vital signs are listed at the bottom of these instructions.   Medications prescribed:    Take any prescribed medications only as prescribed, and any over the counter medications only as directed on the packaging.  1. You are prescribed penicillin, an antibiotic. Please take all of your antibiotics until finished.   You may develop abdominal discomfort or nausea from the antibiotic. If this occurs, you may take it with food. Some patients also get diarrhea with antibiotics. You may help offset this with probiotics which you can buy or get in yogurt. Do not eat or take the probiotics until 2 hours after your antibiotic. Some women develop vaginal yeast infections after antibiotics. If you develop unusual vaginal discharge after being on this medication, please see your primary care provider.   Some people develop allergies to antibiotics. Symptoms of antibiotic allergy can be mild and include a flat rash and itching. They can also be more serious and include:  ?Hives - Hives are raised, red patches of skin that are usually very itchy.  ?Lip or tongue swelling  ?Trouble swallowing or breathing  ?Blistering of the skin or mouth.  If you have any of these serious symptoms, please seek emergency medical care immediately.  2. I recommend ibuprofen, a non-steroidal anti-inflammatory agent (NSAID) for pain. You may take 600 mg every 6 hours as needed for pain. If still requiring this medication around the clock for acute pain after 10  days, please see your primary healthcare provider.  You may combine this medication with Tylenol, 650 mg every 6 hours, so you are receiving something for pain every 3 hours.  This is not a long-term medication unless under the care and direction of your primary provider. Taking this medication long-term and not under the supervision of a healthcare provider could increase the risk of stomach ulcers, kidney problems, and cardiovascular problems such as high blood pressure.   3. You are prescribed viscous lidocaine swish and spit every 6 hours as needed for tooth and gum discomfort. Do not swallow.   4. You are prescribed chlorhexidine solution swish and spit twice daily. Do not swallow. This is to clear bacteria from your mouth.   Home care instructions:  Please follow any educational materials contained in this packet.   Eat a soft or liquid diet and rinse your mouth out after meals with warm water. You should see a dentist or return here at once if you have increased swelling, increased pain or uncontrolled bleeding from the site of your injury.  Follow-up instructions: It is very important that you see a dentist as soon as possible. There is a list of dentists attached to this packet if you do not have care established with a dentist already. Please give a call to a dentist of your choice tomorrow.  Return instructions:  Please return to the Emergency Department if you experience worsening symptoms.  Please seek care if you note any of the following about your dental pain:  You have increased pain not controlled with medicines.  You  have swelling around your tooth, in your face or neck.  You have bleeding which starts, continues, or gets worse.  You have a fever >101 If you are unable to open your mouth Please return if you have any other emergent concerns.  Additional Information:   Your vital signs today were: BP 123/85    Pulse 62    Temp 98.8 F (37.1 C) (Oral)    Resp 18     Ht 5\' 8"  (1.727 m)    Wt 124.7 kg    SpO2 98%    BMI 41.81 kg/m  If your blood pressure (BP) was elevated on multiple readings during this visit above 130 for the top number or above 80 for the bottom number, please have this repeated by your primary care provider within one month. --------------  Thank you for allowing [place] to participate in your care today.

## 2017-12-13 ENCOUNTER — Emergency Department (HOSPITAL_BASED_OUTPATIENT_CLINIC_OR_DEPARTMENT_OTHER)
Admission: EM | Admit: 2017-12-13 | Discharge: 2017-12-13 | Disposition: A | Discharge status: Home / Self Care | Payer: Self-pay | Attending: Emergency Medicine | Admitting: Emergency Medicine

## 2017-12-13 ENCOUNTER — Encounter (HOSPITAL_BASED_OUTPATIENT_CLINIC_OR_DEPARTMENT_OTHER): Payer: Self-pay

## 2017-12-13 ENCOUNTER — Other Ambulatory Visit: Payer: Self-pay

## 2017-12-13 DIAGNOSIS — I10 Essential (primary) hypertension: Secondary | ICD-10-CM | POA: Insufficient documentation

## 2017-12-13 DIAGNOSIS — L03111 Cellulitis of right axilla: Secondary | ICD-10-CM | POA: Insufficient documentation

## 2017-12-13 DIAGNOSIS — F17210 Nicotine dependence, cigarettes, uncomplicated: Secondary | ICD-10-CM | POA: Insufficient documentation

## 2017-12-13 DIAGNOSIS — Z79899 Other long term (current) drug therapy: Secondary | ICD-10-CM | POA: Insufficient documentation

## 2017-12-13 MED ORDER — DOXYCYCLINE HYCLATE 100 MG PO CAPS: 100.0000 mg | ORAL_CAPSULE | Freq: Two times a day (BID) | ORAL | 0 refills | Status: DC

## 2017-12-13 MED ORDER — LIDOCAINE-EPINEPHRINE (PF) 2 %-1:200000 IJ SOLN
10.0000 mL | Freq: Once | INTRAMUSCULAR | Status: AC
  Administered 2017-12-13: 10 mL
  Filled 2017-12-13 (×2): qty 10

## 2017-12-13 NOTE — Discharge Instructions (Addendum)
You are seen in the emergency department today for a soft tissue infection of your right armpit area.  We are placing on doxycycline, an antibiotic, to help treat this.  Please take all of your antibiotics until finished. You may develop abdominal discomfort or diarrhea from the antibiotic.  You may help offset this with probiotics which you can buy at the store (ask your pharmacist if unable to find) or get probiotics in the form of eating yogurt. Do not eat or take the probiotics until 2 hours after your antibiotic. If you are unable to tolerate these side effects follow-up with your primary care provider or return to the emergency department.   If you begin to experience any blistering, rashes, swelling, or difficulty breathing seek medical care for evaluation of potentially more serious side effects.   Please be aware that this medication may interact with other medications you are taking, please be sure to discuss your medication list with your pharmacist.   Please apply warm compresses 4-6 times per day to the right armpit area.  Please have this area rechecked in 2 to 3 days by your primary care provider, the ER, or an urgent care.  Return to the ER sooner for new or worsening symptoms including but not limited to worsening pain, fever, spreading redness from the area, or any other concerns.

## 2017-12-13 NOTE — ED Triage Notes (Signed)
Pt c/o right axilla abscess x 5 days-draining x 3 days-states he was told by work that he had to be seen and have a note for work due to "open wound"-NAD-steady gait

## 2017-12-13 NOTE — ED Provider Notes (Signed)
[facility] EMERGENCY DEPARTMENT Provider Note   CSN: [id number] Arrival date & time: 12/13/17  1250     History   Chief Complaint Chief Complaint  Patient presents with  . Abscess    HPI Johnathan Larson is a 37 y.o. male with history of tobacco abuse and hypertension who presents to the emergency department due to employer request to evaluate right axillary abscess.  States he has a history of similar areas of pain and swelling to the axillas, he states he first noted this area 5 days ago, he states that it eventually opened up and drained purulent fluid with relief.  He states his symptoms have been gradually improving since since this is the area is only sore now.  Other than drainage no specific alleviating or aggravating factors.  Denies fever, chills, nausea, no vomiting, numbness, or weakness.   HPI  Past Medical History:  Diagnosis Date  . Hypertension     There are no active problems to display for this patient.   History reviewed. No pertinent surgical history.      Home Medications    Prior to Admission medications   Medication Sig Start Date End Date Taking? Authorizing Provider  acetaminophen (TYLENOL) 500 MG tablet Take 1 tablet (500 mg total) by mouth every 6 (six) hours as needed. 05/09/17   Law, Waylan Boga, PA-C  amoxicillin (AMOXIL) 500 MG capsule Take 1 capsule (500 mg total) by mouth 3 (three) times daily. 08/28/17   Arthor Captain, PA-C  chlorhexidine (PERIDEX) 0.12 % solution Use as directed 15 mLs in the mouth or throat 2 (two) times daily. Swish and spit.  Do not swallow. 12/11/17   Aviva Kluver B, PA-C  ibuprofen (ADVIL,MOTRIN) 600 MG tablet Take 1 tablet (600 mg total) by mouth every 8 (eight) hours as needed. 06/22/17   Azalia Bilis, MD  lidocaine (XYLOCAINE) 2 % solution Use as directed 15 mLs in the mouth or throat as needed for mouth pain. Swish and spit.  Do not swallow. 12/11/17   Aviva Kluver B, PA-C  meloxicam (MOBIC) 15 MG  tablet Take 1 tablet (15 mg total) by mouth daily. Take 1 daily with food. 08/28/17   Arthor Captain, PA-C  penicillin v potassium (VEETID) 500 MG tablet Take 1 tablet (500 mg total) by mouth 4 (four) times daily for 5 days. 12/11/17 12/16/17  Aviva Kluver B, PA-C  potassium chloride SA (K-DUR,KLOR-CON) 20 MEQ tablet Take 1 tablet (20 mEq total) by mouth 2 (two) times daily for 4 days. 05/18/17 05/22/17  Long, Arlyss Repress, MD  traMADol (ULTRAM) 50 MG tablet Take 1 tablet (50 mg total) by mouth every 6 (six) hours as needed. 08/28/17   Arthor Captain, PA-C    Family History No family history on file.  Social History Social History   Tobacco Use  . Smoking status: Current Every Day Smoker    Types: Cigarettes  . Smokeless tobacco: Never Used  Substance Use Topics  . Alcohol use: Yes    Comment: weekly  . Drug use: No     Allergies   Patient has no known allergies.   Review of Systems Review of Systems  Constitutional: Negative for chills and fever.  Gastrointestinal: Negative for nausea and vomiting.  Skin: Positive for wound.  Neurological: Negative for weakness and numbness.     Physical Exam Updated Vital Signs BP (!) 159/111 (BP Location: Right Arm)   Pulse 68   Temp 98.8 F (37.1 C) (Oral)   Resp  18   Ht 5\' 9"  (1.753 m)   Wt 134.2 kg   SpO2 100%   BMI 43.68 kg/m   Physical Exam  Constitutional: He appears well-developed and well-nourished. No distress.  HENT:  Head: Normocephalic and atraumatic.  Eyes: Conjunctivae are normal. Right eye exhibits no discharge. Left eye exhibits no discharge.  Cardiovascular: Normal rate and regular rhythm.  2+ symmetric radial pulses  Pulmonary/Chest: Effort normal and breath sounds normal.  Musculoskeletal:  Full active range of motion to bilateral shoulders, elbows, and wrists.  Neurological: He is alert.  Clear speech.  5 out of 5 symmetric grip strength.  Sensation grossly intact bilateral upper extremities.   Skin:    Right axilla: There are multiple 0.5 to 2 cm areas of induration with mild overlying warmth to the axillary region.  No obvious palpable fluctuance.  No current active drainage.  No streaking erythema.  Psychiatric: He has a normal mood and affect. His behavior is normal. Thought content normal.  Nursing note and vitals reviewed.   ED Treatments / Results  Labs (all labs ordered are listed, but only abnormal results are displayed) Labs Reviewed - No data to display  EKG None  Radiology No results found.  Procedures Procedures (including critical care time)  EMERGENCY DEPARTMENT US SOFT TISSUE INTERPRETATION "Study: Limited Soft Tissue Ultrasound"  INDICATIONS: Pain and Soft tissue infection Multiple views of the body part were obtained in real-time with a multi-frequency linear probe  PERFORMED BY: Myself IMAGES ARCHIVED?: No SIDE:Right  BODY PART:Axilla INTERPRETATION:  Cellulitis present    Medications Ordered in ED Medications  lidocaine-EPINEPHrine (XYLOCAINE W/EPI) 2 %-1:200000 (PF) injection 10 mL (10 mLs Infiltration Given 12/13/17 1332)     Initial Impression / Assessment and Plan / ED Course  I have reviewed the triage vital signs and the nursing notes.  Pertinent labs & imaging results that were available during my care of the patient were reviewed by me and considered in my medical decision making (see chart for details).   Patient presents to the emergency department for evaluation of right axillary area of swelling/discomfort and prior drainage.  Exam with multiple small areas of induration, no palpable fluctuance, somewhat suspicious for hidradenitis suppurativa.  Bedside ultrasound performed without obvious fluid collection to indicate abscess for incision and drainage today, there are findings consistent with cellulitis. Given recent signs/sxs concerning for abscess with current cellulitis appearance on [place] will cover with doxycycline.  Recommended warm  compresses.  Recheck of area in 2 to 3 days. I discussed results, treatment plan, need for follow-up, and return precautions with the patient. Provided opportunity for questions, patient confirmed understanding and is in agreement with plan.    Final Clinical Impressions(s) / ED Diagnoses   Final diagnoses:  Cellulitis of right axilla    ED Discharge Orders         Ordered    doxycycline (VIBRAMYCIN) 100 MG capsule  2 times daily     12/13/17 1513           Makenzye Troutman, Eudora R, PA-C 12/13/17 1515    Blane Ohara, MD 12/15/17 1714

## 2017-12-27 ENCOUNTER — Encounter (HOSPITAL_BASED_OUTPATIENT_CLINIC_OR_DEPARTMENT_OTHER): Payer: Self-pay | Admitting: Emergency Medicine

## 2017-12-27 ENCOUNTER — Other Ambulatory Visit: Payer: Self-pay

## 2017-12-27 ENCOUNTER — Emergency Department (HOSPITAL_BASED_OUTPATIENT_CLINIC_OR_DEPARTMENT_OTHER)
Admission: EM | Admit: 2017-12-27 | Discharge: 2017-12-27 | Disposition: A | Discharge status: Home / Self Care | Payer: Self-pay | Attending: Emergency Medicine | Admitting: Emergency Medicine

## 2017-12-27 DIAGNOSIS — I10 Essential (primary) hypertension: Secondary | ICD-10-CM | POA: Insufficient documentation

## 2017-12-27 DIAGNOSIS — K0889 Other specified disorders of teeth and supporting structures: Secondary | ICD-10-CM | POA: Insufficient documentation

## 2017-12-27 DIAGNOSIS — F17210 Nicotine dependence, cigarettes, uncomplicated: Secondary | ICD-10-CM | POA: Insufficient documentation

## 2017-12-27 DIAGNOSIS — Z79899 Other long term (current) drug therapy: Secondary | ICD-10-CM | POA: Insufficient documentation

## 2017-12-27 MED ORDER — CHLORHEXIDINE GLUCONATE 0.12 % MT SOLN: 15.0000 mL | Freq: Two times a day (BID) | OROMUCOSAL | 0 refills | Status: DC

## 2017-12-27 MED ORDER — CLINDAMYCIN HCL 150 MG PO CAPS: 300.0000 mg | ORAL_CAPSULE | Freq: Three times a day (TID) | ORAL | 0 refills | Status: AC

## 2017-12-27 NOTE — Discharge Instructions (Addendum)
You have been diagnosed today with Dental Pain.  At this time there does not appear to be the presence of an emergent medical condition, however there is always the potential for conditions to worsen. Please read and follow the below instructions.  Please return to the Emergency Department immediately for any new or worsening symptoms or if your symptoms do not improve. Please be sure to follow up with your Primary Care Provider as soon as possible regarding your visit today; please call their office to schedule an appointment even if you are feeling better for a follow-up visit. Please use the antibiotic medication clindamycin as prescribed to help with your pain.  You may also use the antiseptic mouthwash Peridex as prescribed.  You may use over-the-counter Orajel to help with your pain. Please follow-up with a dentist as soon as possible for further evaluation.  Contact a health care provider if: Your pain is not controlled with medicines. Your symptoms are worse. You have new symptoms. Get help right away if: You are unable to open your mouth. You are having trouble breathing or swallowing. You have a fever. Your face, neck, or jaw is swollen.  Please read the additional information packets attached to your discharge summary.  Do not take your medicine if  develop an itchy rash, swelling in your mouth or lips, or difficulty breathing.   Dental Assistance  If unable to pay or uninsured, contact:  [facility] or [facility]. to become qualified for the adult dental clinic.  Patients with Medicaid: [facility] [phone] [address], [phone] [phone]  If unable to pay, or uninsured, contact [facility] [phone]) or [facility] Department [phone] in [place], [phone] in [facility]) to become qualified for the adult dental clinic   Other Low-Cost Community Dental Services: [facility]- [address]  [place], [place], [place], [address], [phone], Ext. [phone], 2nd and 4th Thursday of the month at 6:30am.  10 clients each day by appointment, can sometimes see walk-in patients if someone does not show for an appointment. [facility]- [address] [place], [place], [address], [phone] [facility] [address], [place], [place], [address], [phone] [facility] Department- [phone] [facility] Health Department- [phone] [facility] Department718 [phone]

## 2017-12-27 NOTE — ED Triage Notes (Signed)
Left upper dental pain since yesterday.  Worse today.  No fever.

## 2017-12-27 NOTE — ED Provider Notes (Signed)
[facility] EMERGENCY DEPARTMENT Provider Note   CSN: [id number] Arrival date & time: 12/27/17  1543     History   Chief Complaint Chief Complaint  Patient presents with  . Dental Pain    HPI Johnathan Larson is a 37 y.o. male presenting for left upper dental pain that began suddenly yesterday after biting down on Halloween candy.  Patient describes his pain as a severe throbbing pain that is constant and worsened with chewing on the left side of his mouth.  Of note patient was seen in the emergency department on 12/13/2017 for right upper dental pain, this resolved following penicillin VK.  Patient denies fever, facial swelling, trouble swallowing, trouble breathing, pain with jaw movement or history of immunocompromising diseases.  HPI  Past Medical History:  Diagnosis Date  . Hypertension     There are no active problems to display for this patient.   No past surgical history on file.      Home Medications    Prior to Admission medications   Medication Sig Start Date End Date Taking? Authorizing Provider  acetaminophen (TYLENOL) 500 MG tablet Take 1 tablet (500 mg total) by mouth every 6 (six) hours as needed. 05/09/17   Law, Waylan Boga, PA-C  amoxicillin (AMOXIL) 500 MG capsule Take 1 capsule (500 mg total) by mouth 3 (three) times daily. 08/28/17   Arthor Captain, PA-C  chlorhexidine (PERIDEX) 0.12 % solution Use as directed 15 mLs in the mouth or throat 2 (two) times daily. Do NOT swallow. 12/27/17   Harlene Salts A, PA-C  clindamycin (CLEOCIN) 150 MG capsule Take 2 capsules (300 mg total) by mouth 3 (three) times daily for 7 days. 12/27/17 01/03/18  Harlene Salts A, PA-C  doxycycline (VIBRAMYCIN) 100 MG capsule Take 1 capsule (100 mg total) by mouth 2 (two) times daily. 12/13/17   Petrucelli, Samantha R, PA-C  ibuprofen (ADVIL,MOTRIN) 600 MG tablet Take 1 tablet (600 mg total) by mouth every 8 (eight) hours as needed. 06/22/17   Azalia Bilis, MD    lidocaine (XYLOCAINE) 2 % solution Use as directed 15 mLs in the mouth or throat as needed for mouth pain. Swish and spit.  Do not swallow. 12/11/17   Aviva Kluver B, PA-C  meloxicam (MOBIC) 15 MG tablet Take 1 tablet (15 mg total) by mouth daily. Take 1 daily with food. 08/28/17   Harris, Cammy Copa, PA-C  potassium chloride SA (K-DUR,KLOR-CON) 20 MEQ tablet Take 1 tablet (20 mEq total) by mouth 2 (two) times daily for 4 days. 05/18/17 05/22/17  Long, Arlyss Repress, MD  traMADol (ULTRAM) 50 MG tablet Take 1 tablet (50 mg total) by mouth every 6 (six) hours as needed. 08/28/17   Arthor Captain, PA-C    Family History No family history on file.  Social History Social History   Tobacco Use  . Smoking status: Current Every Day Smoker    Types: Cigarettes  . Smokeless tobacco: Never Used  Substance Use Topics  . Alcohol use: Yes    Comment: weekly  . Drug use: No     Allergies   Patient has no known allergies.   Review of Systems Review of Systems  Constitutional: Negative.  Negative for chills and fever.  HENT: Positive for dental problem. Negative for drooling, ear pain, facial swelling, rhinorrhea, sore throat, trouble swallowing and voice change.   Eyes: Negative.  Negative for pain and visual disturbance.  Skin: Negative.  Negative for color change.   Physical Exam Updated Vital Signs  BP (!) 154/80 (BP Location: Left Arm)   Pulse 91   Temp 99 F (37.2 C) (Oral)   Resp 18   Ht 5\' 8"  (1.727 m)   Wt 127 kg   SpO2 96%   BMI 42.57 kg/m   Physical Exam  Constitutional: He appears well-developed and well-nourished. No distress.  HENT:  Head: Normocephalic and atraumatic.  Right Ear: Hearing, tympanic membrane, external ear and ear canal normal.  Left Ear: Hearing, tympanic membrane, external ear and ear canal normal.  Nose: Nose normal.  Mouth/Throat: Uvula is midline, oropharynx is clear and moist and mucous membranes are normal. No oral lesions. No trismus in the jaw. Dental  caries present. No dental abscesses or uvula swelling. No oropharyngeal exudate, posterior oropharyngeal edema, posterior oropharyngeal erythema or tonsillar abscesses. Tonsils are 1+ on the right. Tonsils are 1+ on the left. No tonsillar exudate.  Patient with poor dentition overall, multiple dental caries present.  No gingival erythema, fluctuance or swelling present.  No drainage present.  The patient has normal phonation and is in control of secretions. No stridor.  Midline uvula without edema. Soft palate rises symmetrically. No tonsillar erythema, swelling or exudates. Tongue protrusion is normal, floor of mouth is soft. No trismus. No creptius on neck palpation. Mucus membranes moist. No pallor noted.   Eyes: Pupils are equal, round, and reactive to light. Conjunctivae and EOM are normal.  No pain with extraocular motion.  Neck: Trachea normal, normal range of motion, full passive range of motion without pain and phonation normal. Neck supple. No tracheal tenderness present. No neck rigidity. No tracheal deviation, no edema and no erythema present.  Pulmonary/Chest: Effort normal. No respiratory distress.  Abdominal: Soft. There is no tenderness. There is no rebound and no guarding.  Musculoskeletal: Normal range of motion.  Neurological: He is alert. GCS eye subscore is 4. GCS verbal subscore is 5. GCS motor subscore is 6.  Speech is clear and goal oriented, follows commands Major Cranial nerves without deficit, no facial droop Normal strength in upper and lower extremities bilaterally including dorsiflexion and plantar flexion, strong and equal grip strength Sensation normal to light touch Moves extremities without ataxia, coordination intact Normal gait  Skin: Skin is warm and dry.  Psychiatric: He has a normal mood and affect. His behavior is normal.   ED Treatments / Results  Labs (all labs ordered are listed, but only abnormal results are displayed) Labs Reviewed - No data to  display  EKG None  Radiology No results found.  Procedures Procedures (including critical care time)  Medications Ordered in ED Medications - No data to display   Initial Impression / Assessment and Plan / ED Course  I have reviewed the triage vital signs and the nursing notes.  Pertinent labs & imaging results that were available during my care of the patient were reviewed by me and considered in my medical decision making (see chart for details).  Clinical Course as of Dec 27 1620  Wed Dec 27, 2017  1621 Case discussed with Dr. Lockie Mola; advises clindamycin for increased coverage and d/c with dental follow-up.   [BM]    Clinical Course User Index [BM] Bill Salinas, PA-C   Patient with dental pain. Poor dentition overall. Multiple dental caries noted without signs or symptoms of dental abscess, no swelling/erythema/tenderness of the gums.  Patient is well-appearing, afebrile, nontoxic, speaking well.  Patient able to swallow without pain.  No signs of swelling or concern for Ludwig's angina/Peritonsilar abscess/Retropharyngeal  abscess, preseptal/orbital cellulitis or other deep tissue infections of the head, face or neck.  No sign of swelling of the neck, patient has good range of motion of the neck, no trismus.  We will treat with p.o. clindamycin 300 mg 3 times daily for the next 7 days.  Peridex mouthwash given for comfort.  Patient encouraged to use over-the-counter symptomatic treatments such as Orajel for his pain.  Patient received a dentist as soon as possible for reevaluation.  Afebrile, not tachycardic, not hypotensive well-appearing and in no acute distress.  At this time there does not appear to be any evidence of an acute emergency medical condition and the patient appears stable for discharge with appropriate outpatient follow up. Diagnosis was discussed with patient who verbalizes understanding of care plan and is agreeable to discharge. I have discussed return  precautions with patient who verbalizes understanding of return precautions. Patient strongly encouraged to follow-up with their PCP. All questions answered.  Note: Portions of this report may have been transcribed using voice recognition software. Every effort was made to ensure accuracy; however, inadvertent computerized transcription errors may still be present. Final Clinical Impressions(s) / ED Diagnoses   Final diagnoses:  Pain, dental    ED Discharge Orders         Ordered    clindamycin (CLEOCIN) 150 MG capsule  3 times daily     12/27/17 1619    chlorhexidine (PERIDEX) 0.12 % solution  2 times daily     12/27/17 1620           Bill Salinas, PA-C 12/27/17 1630    Virgina Norfolk, DO 12/27/17 2309

## 2017-12-27 NOTE — ED Notes (Signed)
Pt verbalizes understanding of dc instructions and denies any further needs at this time. Pt advised that he needs to find a dentist and was given several pages of affordable dentists in the area.

## 2018-01-02 ENCOUNTER — Emergency Department (HOSPITAL_BASED_OUTPATIENT_CLINIC_OR_DEPARTMENT_OTHER)
Admission: EM | Admit: 2018-01-02 | Discharge: 2018-01-02 | Disposition: A | Discharge status: Home / Self Care | Payer: Self-pay | Attending: Emergency Medicine | Admitting: Emergency Medicine

## 2018-01-02 ENCOUNTER — Encounter (HOSPITAL_BASED_OUTPATIENT_CLINIC_OR_DEPARTMENT_OTHER): Payer: Self-pay

## 2018-01-02 ENCOUNTER — Other Ambulatory Visit: Payer: Self-pay

## 2018-01-02 DIAGNOSIS — H66003 Acute suppurative otitis media without spontaneous rupture of ear drum, bilateral: Secondary | ICD-10-CM | POA: Insufficient documentation

## 2018-01-02 DIAGNOSIS — F17210 Nicotine dependence, cigarettes, uncomplicated: Secondary | ICD-10-CM | POA: Insufficient documentation

## 2018-01-02 DIAGNOSIS — I10 Essential (primary) hypertension: Secondary | ICD-10-CM | POA: Insufficient documentation

## 2018-01-02 MED ORDER — AMOXICILLIN 500 MG PO CAPS
1000.0000 mg | ORAL_CAPSULE | Freq: Once | ORAL | Status: AC
  Administered 2018-01-02: 1000 mg via ORAL
  Filled 2018-01-02: qty 2

## 2018-01-02 MED ORDER — AMOXICILLIN 500 MG PO CAPS: 1000.0000 mg | ORAL_CAPSULE | Freq: Two times a day (BID) | ORAL | 0 refills | Status: DC

## 2018-01-02 NOTE — ED Notes (Signed)
NAD at this time. Pt is stable and going home.  

## 2018-01-02 NOTE — Discharge Instructions (Signed)
Take tylenol 2 pills 4 times a day and motrin 4 pills 3 times a day.  Drink plenty of fluids.  Return for worsening shortness of breath, headache, confusion. Follow up with your family doctor.   

## 2018-01-02 NOTE — ED Provider Notes (Signed)
[facility] EMERGENCY DEPARTMENT Provider Note   CSN: [id number] Arrival date & time: 01/02/18  1457     History   Chief Complaint Chief Complaint  Patient presents with  . Otalgia    HPI Johnathan Larson is a 37 y.o. male.  Johnathan Larson with a chief complaint of right ear pain.  Going on for the past couple days.  Patient has had cough and congestion for the past week or so.  Denies fevers or chills.  Unsure of sick contacts.  The history is provided by the patient.  Otalgia  This is a new problem. The current episode started 2 days ago. There is pain in the right ear. The problem occurs constantly. The problem has been gradually worsening. There has been no fever. The pain is at a severity of 4/10. The pain is moderate. Associated symptoms include cough. Pertinent negatives include no headaches, no abdominal pain, no diarrhea, no vomiting and no rash.    Past Medical History:  Diagnosis Date  . Hypertension     There are no active problems to display for this patient.   History reviewed. No pertinent surgical history.      Home Medications    Prior to Admission medications   Medication Sig Start Date End Date Taking? Authorizing Provider  acetaminophen (TYLENOL) 500 MG tablet Take 1 tablet (500 mg total) by mouth every 6 (six) hours as needed. 05/09/17   Law, Waylan Boga, PA-C  amoxicillin (AMOXIL) 500 MG capsule Take 2 capsules (1,000 mg total) by mouth 2 (two) times daily. 01/02/18   Melene Plan, DO  chlorhexidine (PERIDEX) 0.12 % solution Use as directed 15 mLs in the mouth or throat 2 (two) times daily. Do NOT swallow. 12/27/17   Harlene Salts A, PA-C  clindamycin (CLEOCIN) 150 MG capsule Take 2 capsules (300 mg total) by mouth 3 (three) times daily for 7 days. 12/27/17 01/03/18  Harlene Salts A, PA-C  ibuprofen (ADVIL,MOTRIN) 600 MG tablet Take 1 tablet (600 mg total) by mouth every 8 (eight) hours as needed. 06/22/17   Azalia Bilis, MD  lidocaine  (XYLOCAINE) 2 % solution Use as directed 15 mLs in the mouth or throat as needed for mouth pain. Swish and spit.  Do not swallow. 12/11/17   Aviva Kluver B, PA-C  meloxicam (MOBIC) 15 MG tablet Take 1 tablet (15 mg total) by mouth daily. Take 1 daily with food. 08/28/17   Harris, Cammy Copa, PA-C  potassium chloride SA (K-DUR,KLOR-CON) 20 MEQ tablet Take 1 tablet (20 mEq total) by mouth 2 (two) times daily for 4 days. 05/18/17 05/22/17  Long, Arlyss Repress, MD  traMADol (ULTRAM) 50 MG tablet Take 1 tablet (50 mg total) by mouth every 6 (six) hours as needed. 08/28/17   Arthor Captain, PA-C    Family History No family history on file.  Social History Social History   Tobacco Use  . Smoking status: Current Every Day Smoker    Types: Cigarettes  . Smokeless tobacco: Never Used  Substance Use Topics  . Alcohol use: Yes    Comment: weekly  . Drug use: No     Allergies   Patient has no known allergies.   Review of Systems Review of Systems  Constitutional: Negative for chills and fever.  HENT: Positive for congestion and ear pain. Negative for facial swelling.   Eyes: Negative for discharge and visual disturbance.  Respiratory: Positive for cough. Negative for shortness of breath.   Cardiovascular: Negative for chest pain  and palpitations.  Gastrointestinal: Negative for abdominal pain, diarrhea and vomiting.  Musculoskeletal: Negative for arthralgias and myalgias.  Skin: Negative for color change and rash.  Neurological: Negative for tremors, syncope and headaches.  Psychiatric/Behavioral: Negative for confusion and dysphoric mood.     Physical Exam Updated Vital Signs BP (!) 153/96 (BP Location: Left Arm)   Pulse 90   Temp 98.3 F (36.8 C) (Oral)   Resp 20   Ht 5\' 8"  (1.727 Larson)   Wt 132.9 kg   SpO2 98%   BMI 44.55 kg/Larson   Physical Exam  Constitutional: He is oriented to person, place, and time. He appears well-developed and well-nourished.  HENT:  Head: Normocephalic and  atraumatic.  Swollen turbinates, posterior nasal drip, no noted sinus ttp, tm with bilateral purulent effusion.  Mild bulging to the right   Eyes: Pupils are equal, round, and reactive to light. EOM are normal.  Neck: Normal range of motion. Neck supple. No JVD present.  Cardiovascular: Normal rate and regular rhythm. Exam reveals no gallop and no friction rub.  No murmur heard. Pulmonary/Chest: No respiratory distress. He has no wheezes.  Abdominal: He exhibits no distension. There is no rebound and no guarding.  Musculoskeletal: Normal range of motion.  Neurological: He is alert and oriented to person, place, and time.  Skin: No rash noted. No pallor.  Psychiatric: He has a normal mood and affect. His behavior is normal.  Nursing note and vitals reviewed.    ED Treatments / Results  Labs (all labs ordered are listed, but only abnormal results are displayed) Labs Reviewed - No data to display  EKG None  Radiology No results found.  Procedures Procedures (including critical care time)  Medications Ordered in ED Medications  amoxicillin (AMOXIL) capsule 1,000 mg (has no administration in time range)     Initial Impression / Assessment and Plan / ED Course  I have reviewed the triage vital signs and the nursing notes.  Pertinent labs & imaging results that were available during my care of the patient were reviewed by me and considered in my medical decision making (see chart for details).     Johnathan Larson with a chief complaint of right ear pain.  Going on for the past couple days.  He has had cough and congestion for the past few days.  Denies fevers or chills.  Clinically the patient has otitis.  We will treat with antibiotics.  Discharge home.  3:18 PM:  I have discussed the diagnosis/risks/treatment options with the patient and believe the pt to be eligible for discharge home to follow-up with PCP. We also discussed returning to the ED immediately if new or worsening sx  occur. We discussed the sx which are most concerning (e.g., sudden worsening pain, fever, inability to tolerate by mouth) that necessitate immediate return. Medications administered to the patient during their visit and any new prescriptions provided to the patient are listed below.  Medications given during this visit Medications  amoxicillin (AMOXIL) capsule 1,000 mg (has no administration in time range)      The patient appears reasonably screen and/or stabilized for discharge and I doubt any other medical condition or other [facility] requiring further screening, evaluation, or treatment in the ED at this time prior to discharge.    Final Clinical Impressions(s) / ED Diagnoses   Final diagnoses:  Acute suppurative otitis media of both ears without spontaneous rupture of tympanic membranes, recurrence not specified    ED Discharge Orders  Ordered    amoxicillin (AMOXIL) 500 MG capsule  2 times daily     01/02/18 1514           Melene PlanFloyd, Doll Frazee, DO 01/02/18 1519

## 2018-01-02 NOTE — ED Triage Notes (Signed)
C/o right earache x 2 days-NAD-steady gait

## 2018-01-09 ENCOUNTER — Other Ambulatory Visit: Payer: Self-pay

## 2018-01-09 ENCOUNTER — Encounter (HOSPITAL_BASED_OUTPATIENT_CLINIC_OR_DEPARTMENT_OTHER): Payer: Self-pay | Admitting: *Deleted

## 2018-01-09 ENCOUNTER — Emergency Department (HOSPITAL_BASED_OUTPATIENT_CLINIC_OR_DEPARTMENT_OTHER)
Admission: EM | Admit: 2018-01-09 | Discharge: 2018-01-09 | Disposition: A | Discharge status: Home / Self Care | Payer: Self-pay | Attending: Emergency Medicine | Admitting: Emergency Medicine

## 2018-01-09 DIAGNOSIS — B9789 Other viral agents as the cause of diseases classified elsewhere: Secondary | ICD-10-CM | POA: Insufficient documentation

## 2018-01-09 DIAGNOSIS — J029 Acute pharyngitis, unspecified: Secondary | ICD-10-CM | POA: Insufficient documentation

## 2018-01-09 DIAGNOSIS — F17210 Nicotine dependence, cigarettes, uncomplicated: Secondary | ICD-10-CM | POA: Insufficient documentation

## 2018-01-09 DIAGNOSIS — I10 Essential (primary) hypertension: Secondary | ICD-10-CM | POA: Insufficient documentation

## 2018-01-09 LAB — GROUP A STREP BY PCR: GROUP A STREP BY PCR: NOT DETECTED

## 2018-01-09 MED ORDER — DEXAMETHASONE SODIUM PHOSPHATE 10 MG/ML IJ SOLN
10.0000 mg | Freq: Once | INTRAMUSCULAR | Status: AC
  Administered 2018-01-09: 10 mg via INTRAMUSCULAR
  Filled 2018-01-09: qty 1

## 2018-01-09 NOTE — ED Triage Notes (Signed)
Sore throat this am 

## 2018-01-09 NOTE — ED Provider Notes (Signed)
Emergency Department Provider Note   I have reviewed the triage vital signs and the nursing notes.   HISTORY  Chief Complaint Sore Throat   HPI Johnathan Larson is a 37 y.o. male with past medical history of hypertension presents the emergency department for evaluation of sore throat.  Patient states he had some pain and difficulty swallowing starting this morning.  No trouble breathing or drooling. Denies fever or chills.  No sick contacts.  No new medications.  No itching or rash.  No radiation of symptoms or other modifying factors.  Past Medical History:  Diagnosis Date  . Hypertension     There are no active problems to display for this patient.   History reviewed. No pertinent surgical history.  Allergies Patient has no known allergies.  No family history on file.  Social History Social History   Tobacco Use  . Smoking status: Current Every Day Smoker    Types: Cigarettes  . Smokeless tobacco: Never Used  Substance Use Topics  . Alcohol use: Yes    Comment: weekly  . Drug use: No    Review of Systems  Constitutional: No fever/chills Eyes: No visual changes. ENT: Positive sore throat. Skin: Negative for rash. Neurological: Negative for headaches.  10-point ROS otherwise negative.  ____________________________________________   PHYSICAL EXAM:  VITAL SIGNS: ED Triage Vitals  Enc Vitals Group     BP 01/09/18 1551 (!) 138/98     Pulse Rate 01/09/18 1551 99     Resp 01/09/18 1551 16     Temp 01/09/18 1551 98.4 F (36.9 C)     Temp Source 01/09/18 1551 Oral     SpO2 01/09/18 1551 99 %     Weight 01/09/18 1548 292 lb 15.9 oz (132.9 kg)     Height 01/09/18 1548 5\' 8"  (1.727 m)     Pain Score 01/09/18 1548 3   Constitutional: Alert and oriented. Well appearing and in no acute distress. Eyes: Conjunctivae are normal.  Head: Atraumatic. Nose: No congestion/rhinnorhea. Mouth/Throat: Mucous membranes are moist. Erythema in the posterior pharynx  without edema. No PTA. Clear voice. Managing oral secretions. No breathing difficulty.  Neck: No stridor. Cardiovascular: Normal rate, regular rhythm.   Respiratory: Normal respiratory effort.  Gastrointestinal: No distention.  Musculoskeletal: No gross deformities of extremities. Neurologic:  Normal speech and language. Skin:  Skin is warm, dry and intact. No rash noted.  ____________________________________________   LABS (all labs ordered are listed, but only abnormal results are displayed)  Labs Reviewed  GROUP A STREP BY PCR   ____________________________________________   PROCEDURES  Procedure(s) performed:   Procedures  None ____________________________________________   INITIAL IMPRESSION / ASSESSMENT AND PLAN / ED COURSE  Pertinent labs & imaging results that were available during my care of the patient were reviewed by me and considered in my medical decision making (see chart for details).  Patient presents to the emergency department with sore throat.  No peritonsillar abscess.  He is managing his oral secretions and speaking in a clear voice.  No stridor.  Strep PCR is negative.  Patient given intramuscular Decadron and advised to take Tylenol and/or Motrin for pain as needed at home.  Discussed ED return precautions in detail.  ____________________________________________  FINAL CLINICAL IMPRESSION(S) / ED DIAGNOSES  Final diagnoses:  Viral pharyngitis     MEDICATIONS GIVEN DURING THIS VISIT:  Medications  dexamethasone (DECADRON) injection 10 mg (10 mg Intramuscular Given 01/09/18 1628)    Note:  This document was prepared  using Conservation officer, historic buildings and may include unintentional dictation errors.  Alona Bene, MD Emergency Medicine    Adon Gehlhausen, Arlyss Repress, MD 01/10/18 [phone]

## 2018-01-09 NOTE — Discharge Instructions (Signed)

## 2018-01-30 ENCOUNTER — Other Ambulatory Visit: Payer: Self-pay

## 2018-01-30 ENCOUNTER — Encounter (HOSPITAL_BASED_OUTPATIENT_CLINIC_OR_DEPARTMENT_OTHER): Payer: Self-pay | Admitting: *Deleted

## 2018-01-30 ENCOUNTER — Emergency Department (HOSPITAL_BASED_OUTPATIENT_CLINIC_OR_DEPARTMENT_OTHER)
Admission: EM | Admit: 2018-01-30 | Discharge: 2018-01-30 | Disposition: A | Discharge status: Home / Self Care | Payer: Self-pay | Attending: Emergency Medicine | Admitting: Emergency Medicine

## 2018-01-30 DIAGNOSIS — F17210 Nicotine dependence, cigarettes, uncomplicated: Secondary | ICD-10-CM | POA: Insufficient documentation

## 2018-01-30 DIAGNOSIS — R51 Headache: Secondary | ICD-10-CM | POA: Insufficient documentation

## 2018-01-30 DIAGNOSIS — R0981 Nasal congestion: Secondary | ICD-10-CM | POA: Insufficient documentation

## 2018-01-30 DIAGNOSIS — I10 Essential (primary) hypertension: Secondary | ICD-10-CM | POA: Insufficient documentation

## 2018-01-30 DIAGNOSIS — R519 Headache, unspecified: Secondary | ICD-10-CM

## 2018-01-30 DIAGNOSIS — Z79899 Other long term (current) drug therapy: Secondary | ICD-10-CM | POA: Insufficient documentation

## 2018-01-30 MED ORDER — ONDANSETRON 4 MG PO TBDP
4.0000 mg | ORAL_TABLET | Freq: Once | ORAL | Status: AC
  Administered 2018-01-30: 4 mg via ORAL
  Filled 2018-01-30: qty 1

## 2018-01-30 MED ORDER — ONDANSETRON 4 MG PO TBDP: 4.0000 mg | ORAL_TABLET | Freq: Three times a day (TID) | ORAL | 0 refills | Status: DC | PRN

## 2018-01-30 NOTE — Discharge Instructions (Addendum)
Flonase as needed for nasal congestion. Tylenol and/or Ibuprofen as needed for headache.  Zofran as needed for nausea.   Follow up with your doctor if symptoms are not improving.   Drink plenty of fluids at home. This will help with your headache.   If you develop worsening headache, new fever, new neck stiffness, rash, weakness, numbness, trouble with your speech, trouble walking, new or worsening symptoms or any concerning symptoms, please return to the ED immediately.

## 2018-01-30 NOTE — ED Provider Notes (Signed)
[facility] EMERGENCY DEPARTMENT Provider Note   CSN: [id number] Arrival date & time: 01/30/18  1721     History   Chief Complaint Chief Complaint  Patient presents with  . Nasal Congestion    HPI Johnathan Larson is a 37 y.o. male.  The history is provided by the patient and medical records. No language interpreter was used.   Johnathan Larson is a 37 y.o. male  with a PMH of HTN who presents to the Emergency Department complaining of congestion and headache which began while at work today.  He states that he left work because his symptoms were so bad.  He did take 2 BC powders and headache has been improving.  He states that it is now nearly resolved.  Denies any fever or chills.  No cough, chest pain or shortness of breath.  No abdominal pain, nausea or vomiting.  Has had several sick contacts at work.  No visual changes, neck pain or rashes.  Past Medical History:  Diagnosis Date  . Hypertension     There are no active problems to display for this patient.   History reviewed. No pertinent surgical history.      Home Medications    Prior to Admission medications   Medication Sig Start Date End Date Taking? Authorizing Provider  acetaminophen (TYLENOL) 500 MG tablet Take 1 tablet (500 mg total) by mouth every 6 (six) hours as needed. 05/09/17   Law, Waylan BogaAlexandra M, PA-C  amoxicillin (AMOXIL) 500 MG capsule Take 2 capsules (1,000 mg total) by mouth 2 (two) times daily. 01/02/18   Melene PlanFloyd, Dan, DO  chlorhexidine (PERIDEX) 0.12 % solution Use as directed 15 mLs in the mouth or throat 2 (two) times daily. Do NOT swallow. 12/27/17   Harlene SaltsMorelli, Brandon A, PA-C  ibuprofen (ADVIL,MOTRIN) 600 MG tablet Take 1 tablet (600 mg total) by mouth every 8 (eight) hours as needed. 06/22/17   Azalia Bilisampos, Kevin, MD  lidocaine (XYLOCAINE) 2 % solution Use as directed 15 mLs in the mouth or throat as needed for mouth pain. Swish and spit.  Do not swallow. 12/11/17   Aviva KluverMurray, Alyssa B, PA-C    meloxicam (MOBIC) 15 MG tablet Take 1 tablet (15 mg total) by mouth daily. Take 1 daily with food. 08/28/17   Harris, Cammy CopaAbigail, PA-C  ondansetron (ZOFRAN ODT) 4 MG disintegrating tablet Take 1 tablet (4 mg total) by mouth every 8 (eight) hours as needed for nausea or vomiting. 01/30/18   Loyce Klasen, Chase PicketJaime Pilcher, PA-C  potassium chloride SA (K-DUR,KLOR-CON) 20 MEQ tablet Take 1 tablet (20 mEq total) by mouth 2 (two) times daily for 4 days. 05/18/17 05/22/17  Long, Arlyss RepressJoshua G, MD  traMADol (ULTRAM) 50 MG tablet Take 1 tablet (50 mg total) by mouth every 6 (six) hours as needed. 08/28/17   Arthor CaptainHarris, Abigail, PA-C    Family History History reviewed. No pertinent family history.  Social History Social History   Tobacco Use  . Smoking status: Current Every Day Smoker    Types: Cigarettes  . Smokeless tobacco: Never Used  Substance Use Topics  . Alcohol use: Yes    Comment: weekly  . Drug use: No     Allergies   Patient has no known allergies.   Review of Systems Review of Systems  HENT: Positive for congestion.   Gastrointestinal: Positive for nausea. Negative for abdominal pain, blood in stool, constipation, diarrhea and vomiting.  Neurological: Positive for headaches.  All other systems reviewed and are negative.  Physical Exam Updated Vital Signs BP (!) 150/99 (BP Location: Right Arm)   Pulse 73   Temp 98.3 F (36.8 C) (Oral)   Resp 18   Ht 5\' 8"  (1.727 m)   Wt 127 kg   SpO2 98%   BMI 42.57 kg/m   Physical Exam  Constitutional: He appears well-developed and well-nourished. No distress.  HENT:  Head: Normocephalic and atraumatic.  + Mucosal edema nasal congestion.  No tonsillar exudates or hypertrophy. No areas of focal sinus tenderness.  Neck: Neck supple.  No meningeal signs.  Cardiovascular: Normal rate, regular rhythm and normal heart sounds.  No murmur heard. Pulmonary/Chest: Effort normal and breath sounds normal. No respiratory distress. He has no wheezes. He has no  rales.  Lungs clear to auscultation bilaterally.  Musculoskeletal: Normal range of motion.  Neurological: He is alert.  Speech clear and goal oriented. CN 2-12 grossly intact. Normal finger-to-nose and rapid alternating movements. No drift. Strength and sensation intact. Steady gait.   Skin: Skin is warm and dry.  Nursing note and vitals reviewed.    ED Treatments / Results  Labs (all labs ordered are listed, but only abnormal results are displayed) Labs Reviewed - No data to display  EKG None  Radiology No results found.  Procedures Procedures (including critical care time)  Medications Ordered in ED Medications  ondansetron (ZOFRAN-ODT) disintegrating tablet 4 mg (4 mg Oral Given 01/30/18 1901)     Initial Impression / Assessment and Plan / ED Course  I have reviewed the triage vital signs and the nursing notes.  Pertinent labs & imaging results that were available during my care of the patient were reviewed by me and considered in my medical decision making (see chart for details).    Johnathan Larson is a 37 y.o. male who presents to ED for nasal congestion, nausea and headache which began today.  Afebrile and hemodynamically stable with clear lung exam and no focal neuro deficits.  No nuchal rigidity to suggest meningitis.  No focal sinus tenderness.  Headache is improving with BC powder taken prior to arrival.  Offered further medication for headache while in ED and he declined. Evaluation does not show pathology that would require ongoing emergent intervention or inpatient treatment.  Work note provided at patient request.  Flonase for nasal congestion.  Zofran as needed for nausea.  Follow-up with primary doctor if symptoms persist.  Reasons to return to ER discussed and all questions answered.   Final Clinical Impressions(s) / ED Diagnoses   Final diagnoses:  Nasal congestion  Bad headache    ED Discharge Orders         Ordered    ondansetron (ZOFRAN ODT) 4 MG  disintegrating tablet  Every 8 hours PRN     01/30/18 1857           Rolen Conger, Chase Picket, PA-C 01/30/18 2050    Tegeler, Canary Brim, MD 01/31/18 [phone]

## 2018-01-30 NOTE — ED Triage Notes (Signed)
Pt c/o nasal congestion and h/a x 1 day

## 2018-02-20 ENCOUNTER — Encounter (HOSPITAL_BASED_OUTPATIENT_CLINIC_OR_DEPARTMENT_OTHER): Payer: Self-pay | Admitting: Emergency Medicine

## 2018-02-20 ENCOUNTER — Emergency Department (HOSPITAL_BASED_OUTPATIENT_CLINIC_OR_DEPARTMENT_OTHER)
Admission: EM | Admit: 2018-02-20 | Discharge: 2018-02-20 | Disposition: A | Discharge status: Home / Self Care | Payer: Self-pay | Attending: Emergency Medicine | Admitting: Emergency Medicine

## 2018-02-20 ENCOUNTER — Other Ambulatory Visit: Payer: Self-pay

## 2018-02-20 DIAGNOSIS — K047 Periapical abscess without sinus: Secondary | ICD-10-CM | POA: Insufficient documentation

## 2018-02-20 MED ORDER — BUPIVACAINE-EPINEPHRINE (PF) 0.5% -1:200000 IJ SOLN
INTRAMUSCULAR | Status: AC
  Filled 2018-02-20: qty 1.8

## 2018-02-20 MED ORDER — HYDROCODONE-ACETAMINOPHEN 5-325 MG PO TABS: 1.0000 | ORAL_TABLET | Freq: Four times a day (QID) | ORAL | 0 refills | Status: DC | PRN

## 2018-02-20 MED ORDER — PENICILLIN V POTASSIUM 250 MG PO TABS: 250.0000 mg | ORAL_TABLET | Freq: Four times a day (QID) | ORAL | 0 refills | Status: AC

## 2018-02-20 MED ORDER — BUPIVACAINE-EPINEPHRINE (PF) 0.5% -1:200000 IJ SOLN
1.8000 mL | Freq: Once | INTRAMUSCULAR | Status: AC
  Administered 2018-02-20: 1.8 mL

## 2018-02-20 NOTE — ED Provider Notes (Signed)
[facility] EMERGENCY DEPARTMENT Provider Note   CSN: [id number] Arrival date & time: 02/20/18  1627     History   Chief Complaint Chief Complaint  Patient presents with  . Dental Pain    HPI Johnathan Larson is a 37 y.o. male.  The history is provided by the patient.  Dental Pain   This is a new problem. Episode onset: 7 days ago. The problem occurs constantly. The problem has been gradually worsening. The pain is at a severity of 9/10. The pain is severe. He has tried acetaminophen for the symptoms. The treatment provided no relief.    Past Medical History:  Diagnosis Date  . Hypertension     There are no active problems to display for this patient.   History reviewed. No pertinent surgical history.      Home Medications    Prior to Admission medications   Medication Sig Start Date End Date Taking? Authorizing Provider  acetaminophen (TYLENOL) 500 MG tablet Take 1 tablet (500 mg total) by mouth every 6 (six) hours as needed. 05/09/17   Law, Waylan BogaAlexandra M, PA-C  amoxicillin (AMOXIL) 500 MG capsule Take 2 capsules (1,000 mg total) by mouth 2 (two) times daily. 01/02/18   Melene PlanFloyd, Dan, DO  chlorhexidine (PERIDEX) 0.12 % solution Use as directed 15 mLs in the mouth or throat 2 (two) times daily. Do NOT swallow. 12/27/17   Harlene SaltsMorelli, Brandon A, PA-C  ibuprofen (ADVIL,MOTRIN) 600 MG tablet Take 1 tablet (600 mg total) by mouth every 8 (eight) hours as needed. 06/22/17   Azalia Bilisampos, Kevin, MD  lidocaine (XYLOCAINE) 2 % solution Use as directed 15 mLs in the mouth or throat as needed for mouth pain. Swish and spit.  Do not swallow. 12/11/17   Aviva KluverMurray, Alyssa B, PA-C  meloxicam (MOBIC) 15 MG tablet Take 1 tablet (15 mg total) by mouth daily. Take 1 daily with food. 08/28/17   Harris, Cammy CopaAbigail, PA-C  ondansetron (ZOFRAN ODT) 4 MG disintegrating tablet Take 1 tablet (4 mg total) by mouth every 8 (eight) hours as needed for nausea or vomiting. 01/30/18   Ward, Chase PicketJaime Pilcher, PA-C    potassium chloride SA (K-DUR,KLOR-CON) 20 MEQ tablet Take 1 tablet (20 mEq total) by mouth 2 (two) times daily for 4 days. 05/18/17 05/22/17  Long, Arlyss RepressJoshua G, MD  traMADol (ULTRAM) 50 MG tablet Take 1 tablet (50 mg total) by mouth every 6 (six) hours as needed. 08/28/17   Arthor CaptainHarris, Abigail, PA-C    Family History No family history on file.  Social History Social History   Tobacco Use  . Smoking status: Current Every Day Smoker    Packs/day: 0.50    Types: Cigarettes  . Smokeless tobacco: Never Used  Substance Use Topics  . Alcohol use: Not Currently  . Drug use: No     Allergies   Patient has no known allergies.   Review of Systems Review of Systems  All other systems reviewed and are negative.    Physical Exam Updated Vital Signs BP (!) 143/90 (BP Location: Right Arm)   Pulse 95   Temp 99 F (37.2 C) (Oral)   Resp 16   Ht 5\' 8"  (1.727 m)   Wt 136.1 kg   SpO2 98%   BMI 45.61 kg/m   Physical Exam Vitals signs and nursing note reviewed.  Constitutional:      General: He is not in acute distress.    Appearance: Normal appearance. He is obese.  HENT:  Head: Normocephalic.     Comments: No tongue swelling or difficulty swallowing    Mouth/Throat:     Mouth: Mucous membranes are moist.     Dentition: Gingival swelling, dental caries and dental abscesses present.     Tongue: No lesions.   Cardiovascular:     Rate and Rhythm: Normal rate.  Pulmonary:     Effort: Pulmonary effort is normal.  Skin:    General: Skin is warm.  Neurological:     General: No focal deficit present.     Mental Status: He is alert.  Psychiatric:        Mood and Affect: Mood normal.      ED Treatments / Results  Labs (all labs ordered are listed, but only abnormal results are displayed) Labs Reviewed - No data to display  EKG None  Radiology No results found.  Procedures Procedures (including critical care time)  Medications Ordered in ED Medications   bupivacaine-epinephrine (MARCAINE W/ EPI) 0.5% -1:200000 injection 1.8 mL (has no administration in time range)  bupivacaine-epinephrine (MARCAINE W/ EPI) 0.5% -1:200000 injection (has no administration in time range)     Initial Impression / Assessment and Plan / ED Course  I have reviewed the triage vital signs and the nursing notes.  Pertinent labs & imaging results that were available during my care of the patient were reviewed by me and considered in my medical decision making (see chart for details).     Pt with dental caries and no facial swelling.  No signs of ludwig's angina or difficulty swallowing and no systemic symptoms. Will treat with PCN and have pt f/u with dentist.   Final Clinical Impressions(s) / ED Diagnoses   Final diagnoses:  Dental abscess    ED Discharge Orders         Ordered    penicillin v potassium (VEETID) 250 MG tablet  4 times daily     02/20/18 1845    HYDROcodone-acetaminophen (NORCO/VICODIN) 5-325 MG tablet  Every 6 hours PRN     02/20/18 1845           Gwyneth SproutPlunkett, Chimamanda Siegfried, MD 02/20/18 1845

## 2018-02-20 NOTE — ED Triage Notes (Signed)
Broken tooth on left upper rear since just after Christmas.  Pts dentist is "closed.  I tried to call them."  Sts the pain is getting worse.

## 2018-02-20 NOTE — Discharge Instructions (Signed)
Take Aleve or Advil in addition to the pain medication.  Avoid really hot or really cocoa foods

## 2018-04-05 ENCOUNTER — Encounter (HOSPITAL_BASED_OUTPATIENT_CLINIC_OR_DEPARTMENT_OTHER): Payer: Self-pay | Admitting: Emergency Medicine

## 2018-04-05 ENCOUNTER — Other Ambulatory Visit: Payer: Self-pay

## 2018-04-05 ENCOUNTER — Emergency Department (HOSPITAL_BASED_OUTPATIENT_CLINIC_OR_DEPARTMENT_OTHER)
Admission: EM | Admit: 2018-04-05 | Discharge: 2018-04-05 | Disposition: A | Discharge status: Home / Self Care | Payer: Self-pay | Attending: Emergency Medicine | Admitting: Emergency Medicine

## 2018-04-05 DIAGNOSIS — I10 Essential (primary) hypertension: Secondary | ICD-10-CM | POA: Insufficient documentation

## 2018-04-05 DIAGNOSIS — F17210 Nicotine dependence, cigarettes, uncomplicated: Secondary | ICD-10-CM | POA: Insufficient documentation

## 2018-04-05 DIAGNOSIS — K0889 Other specified disorders of teeth and supporting structures: Secondary | ICD-10-CM | POA: Insufficient documentation

## 2018-04-05 MED ORDER — PENICILLIN V POTASSIUM 500 MG PO TABS: 500.0000 mg | ORAL_TABLET | Freq: Four times a day (QID) | ORAL | 0 refills | Status: AC

## 2018-04-05 MED ORDER — IBUPROFEN 800 MG PO TABS: 800.0000 mg | ORAL_TABLET | Freq: Three times a day (TID) | ORAL | 0 refills | Status: DC | PRN

## 2018-04-05 NOTE — ED Triage Notes (Signed)
Reports right upper dental pain which began yesterday.  Denies dental follow up.

## 2018-04-05 NOTE — Discharge Instructions (Signed)
You have dental pain here in the ED. This should improve with antibiotics and motrin. Call the dentist listed for evaluation.

## 2018-04-05 NOTE — ED Provider Notes (Signed)
Emergency Department Provider Note   I have reviewed the triage vital signs and the nursing notes.   HISTORY  Chief Complaint Dental Pain   HPI Johnathan Larson is a 38 y.o. male presents to the emergency department with right upper dental pain worsening over the past several days.  Patient denies any difficulty opening his mouth, swallowing, breathing.  He does not have dental follow-up or regular dental care.  He denies any fevers or chills.  He has noticed some swelling to the outside of his face/cheek.  No voice change.  Pain is moderate, constant and without radiation.  No modifying factors.  Past Medical History:  Diagnosis Date  . Hypertension     There are no active problems to display for this patient.   History reviewed. No pertinent surgical history.  Allergies Patient has no known allergies.  History reviewed. No pertinent family history.  Social History Social History   Tobacco Use  . Smoking status: Current Every Day Smoker    Packs/day: 0.50    Types: Cigarettes  . Smokeless tobacco: Never Used  Substance Use Topics  . Alcohol use: Not Currently  . Drug use: No    Review of Systems  Constitutional: No fever/chills Eyes: No visual changes. ENT: No sore throat. Positive dental pain.  Skin: Negative for rash. Neurological: Negative for headaches  10-point ROS otherwise negative.  ____________________________________________   PHYSICAL EXAM:  VITAL SIGNS: ED Triage Vitals [04/05/18 1821]  Enc Vitals Group     BP (!) 162/91     Pulse Rate 70     Resp 18     Temp 98.6 F (37 C)     Temp Source Oral     SpO2 99 %     Weight 290 lb (131.5 kg)     Height 5\' 8"  (1.727 m)     Pain Score 9   Constitutional: Alert and oriented. Well appearing and in no acute distress. Eyes: Conjunctivae are normal. PERRL. EOMI. Head: Atraumatic. Nose: No congestion/rhinnorhea. Mouth/Throat: Mucous membranes are moist. No dental abscess visualized. No  trismus. Managing oral secretions and speaking in a clear voice. No trismus. Neck: No stridor.  Cardiovascular: Normal rate, regular rhythm. Respiratory: Normal respiratory effort.   Gastrointestinal:No distention.  Musculoskeletal:  No gross deformities of extremities. Neurologic:  Normal speech and language.  Skin: No rash noted.   ____________________________________________  RADIOLOGY  None ____________________________________________   PROCEDURES  Procedure(s) performed:   Procedures  None ____________________________________________   INITIAL IMPRESSION / ASSESSMENT AND PLAN / ED COURSE  Pertinent labs & imaging results that were available during my care of the patient were reviewed by me and considered in my medical decision making (see chart for details).  Patient presents to the emergency department with right upper dental pain.  No visualized abscess.  Soft submandibular compartment without concern for Ludwig angina.  Patient has no trismus.  Plan for penicillin, Motrin/Tylenol.  List of dental resources provided at discharge and patient encouraged to begin calling tomorrow to make a follow-up appointment in the next week.   ____________________________________________  FINAL CLINICAL IMPRESSION(S) / ED DIAGNOSES  Final diagnoses:  Pain, dental     NEW OUTPATIENT MEDICATIONS STARTED DURING THIS VISIT:  Discharge Medication List as of 04/05/2018  7:51 PM    START taking these medications   Details  penicillin v potassium (VEETID) 500 MG tablet Take 1 tablet (500 mg total) by mouth 4 (four) times daily for 7 days., Starting Thu 04/05/2018, Until  Thu 04/12/2018, Print        Note:  This document was prepared using Dragon voice recognition software and may include unintentional dictation errors.  Alona BeneJoshua Long, MD Emergency Medicine    Long, Arlyss RepressJoshua G, MD 04/06/18 [phone]

## 2018-10-23 IMAGING — CR DG FOOT COMPLETE 3+V*R*
3 series · 3 of 3 positions shown · non-contrast
Comparison: None.

CLINICAL DATA: 35-year-old male with right foot pain along the
plantar and lateral aspects for 1 month. No known injury.

EXAM:
RIGHT FOOT COMPLETE - 3+ VIEW

[t foot ap right]
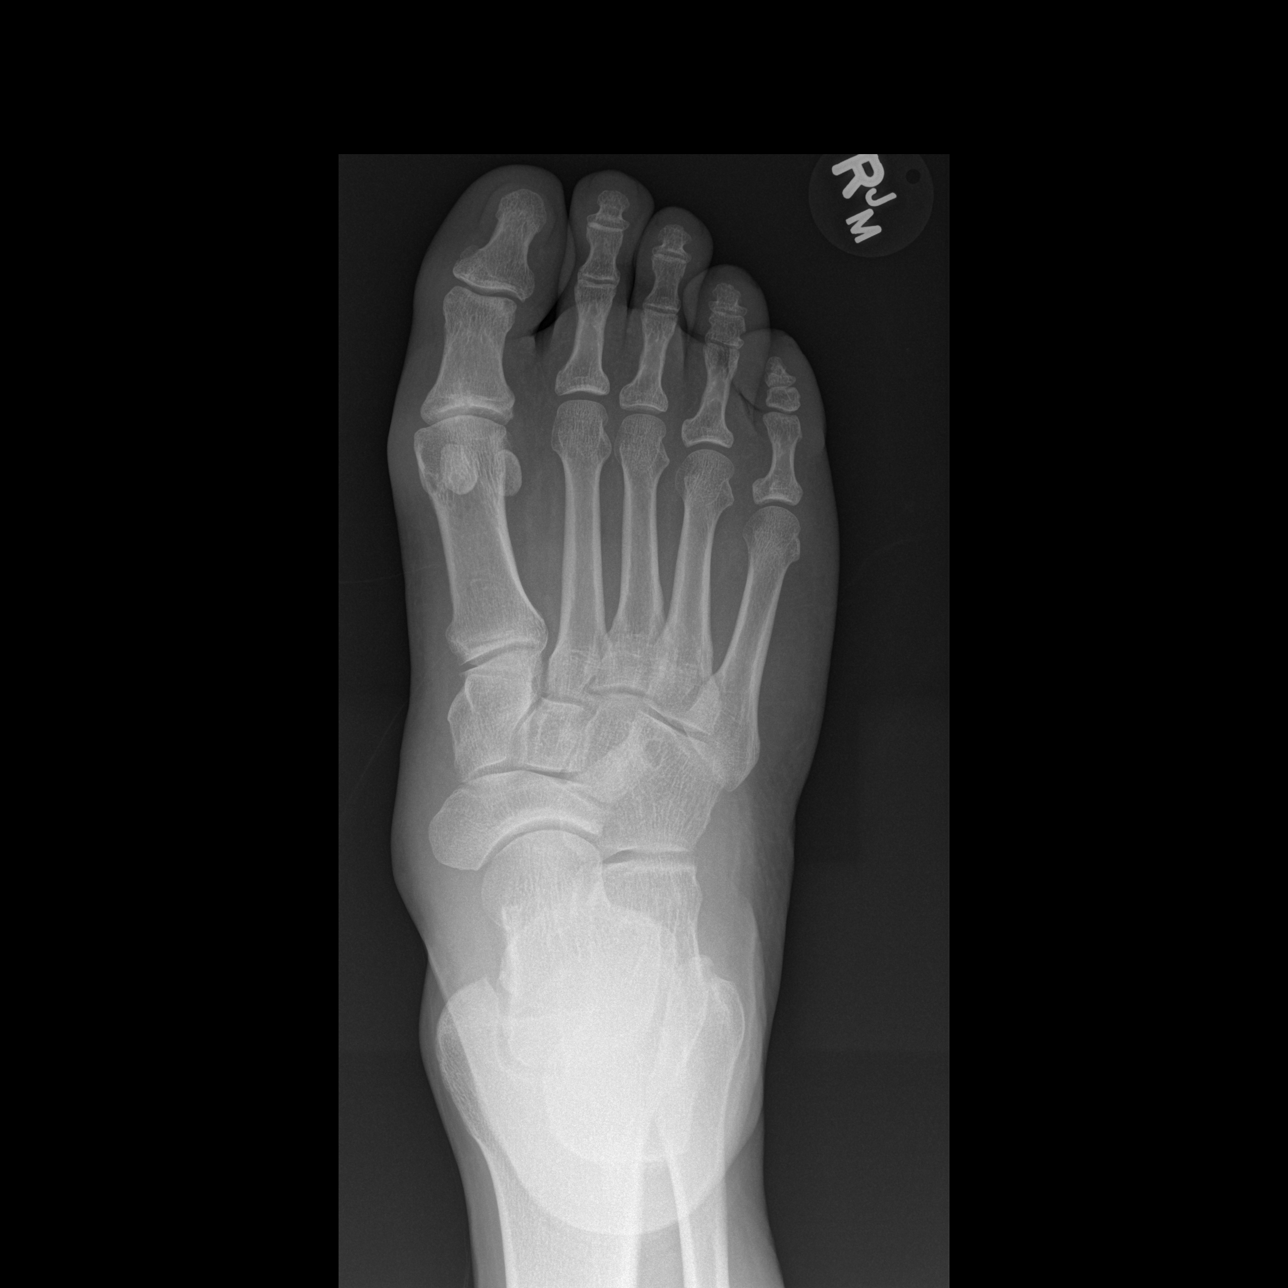

[t foot oblique right]
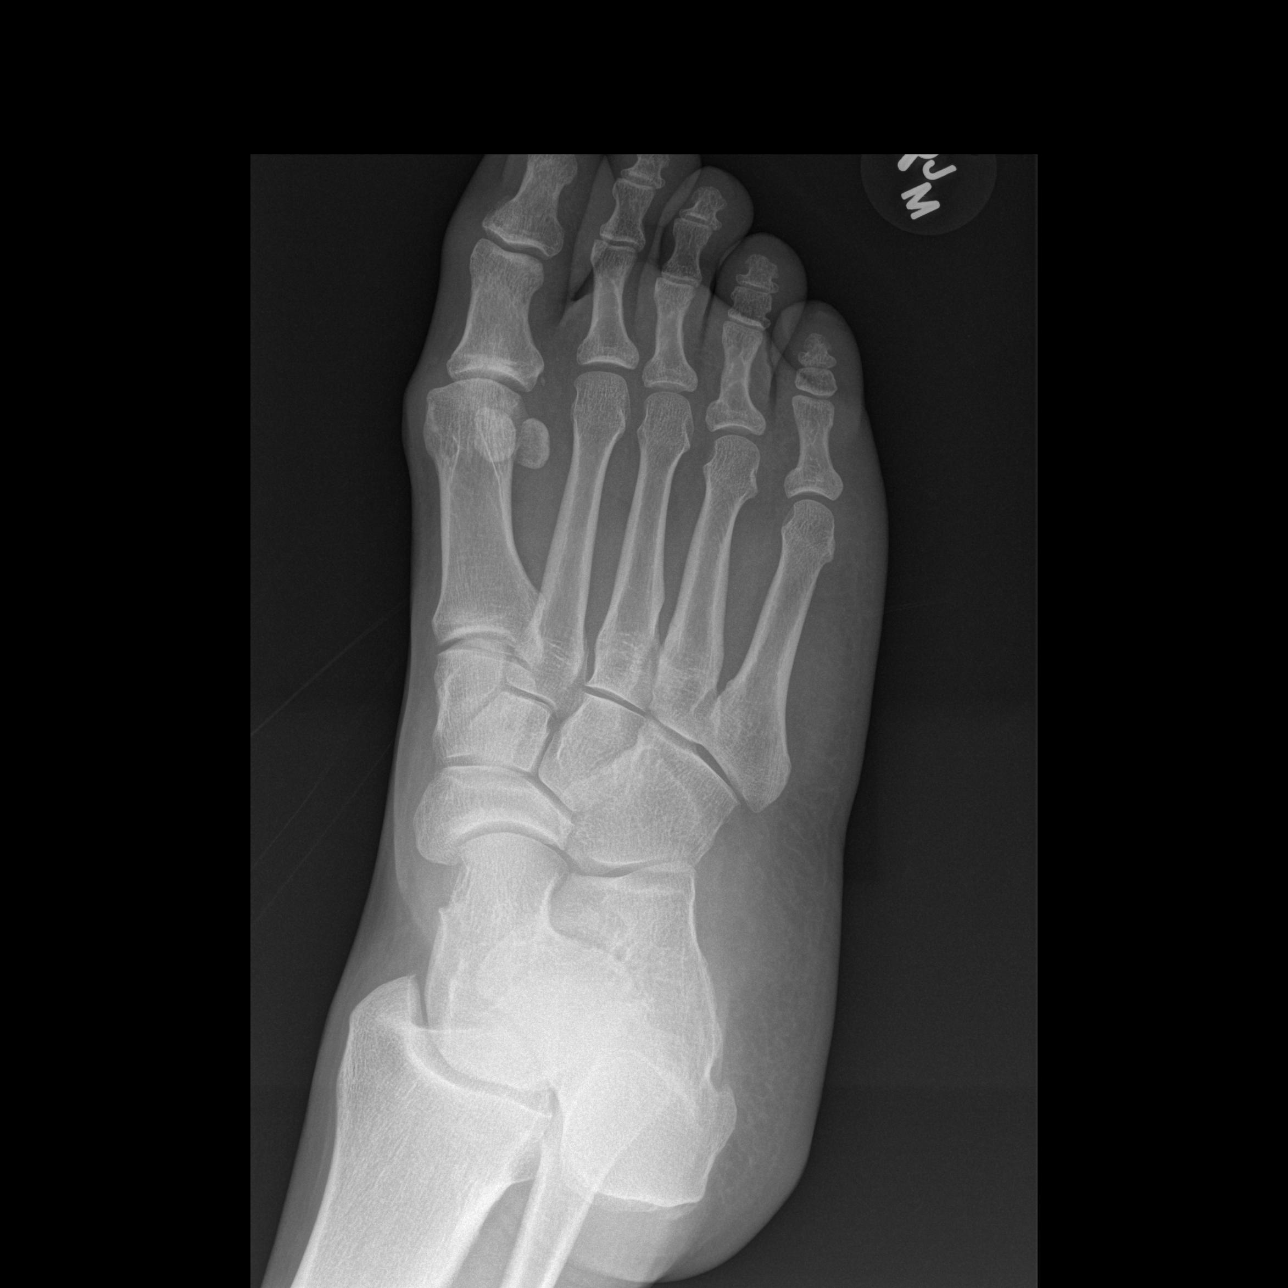

[t foot lat right]
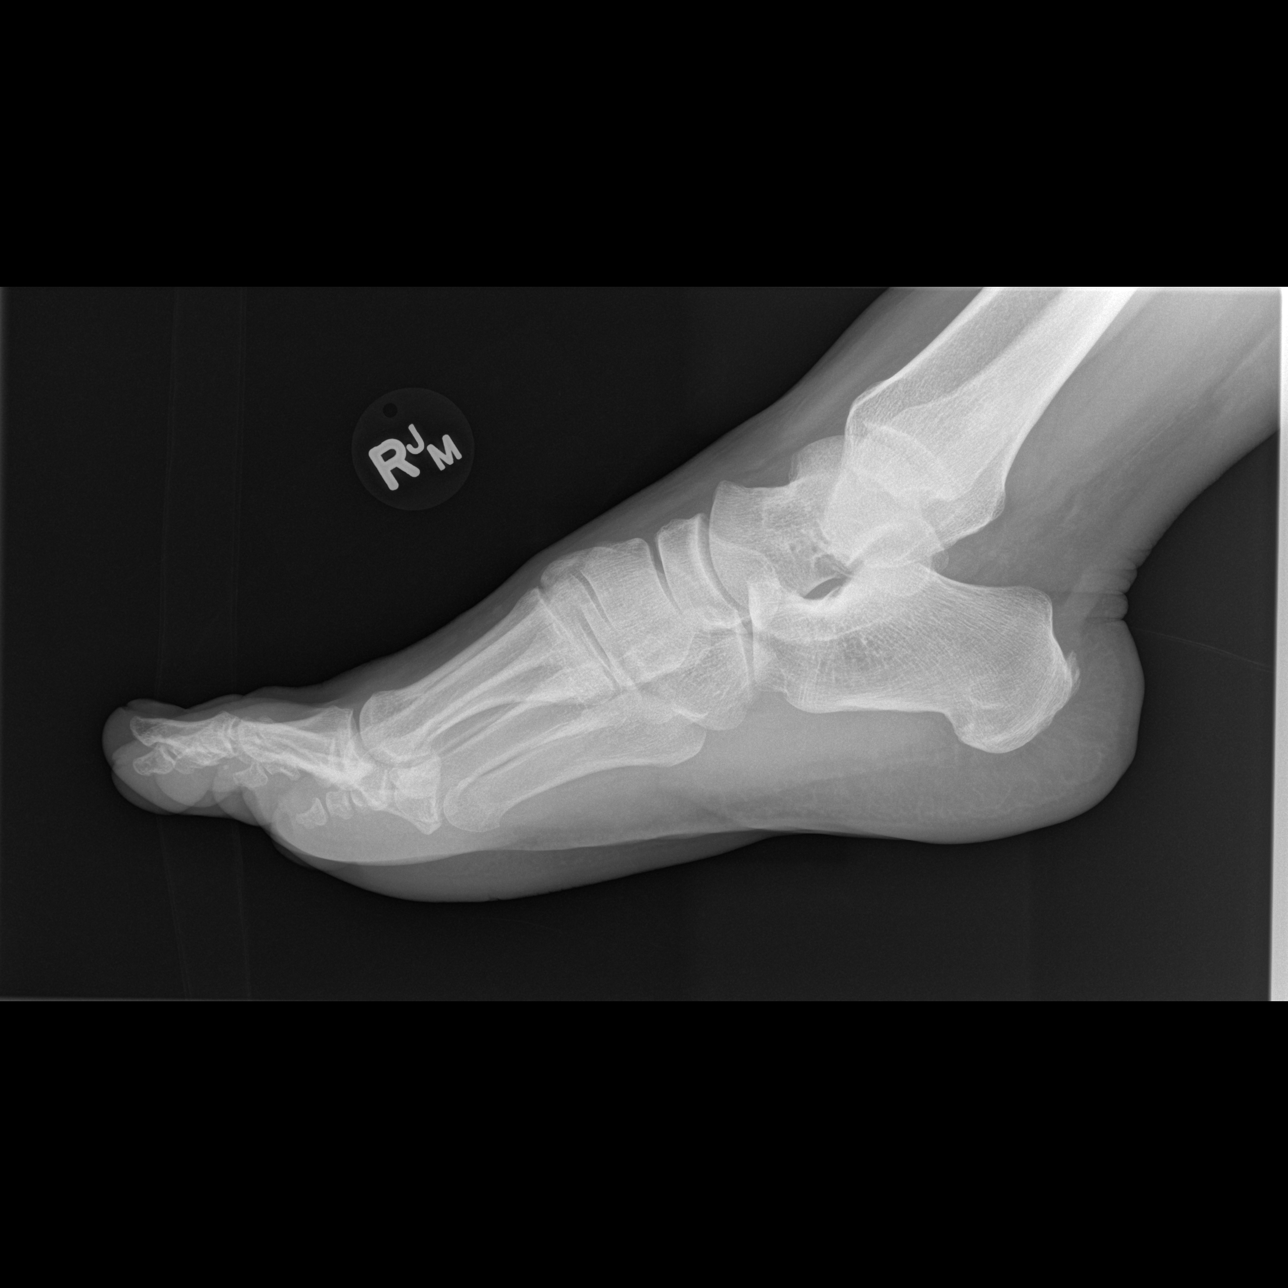

[3 of 3 positions shown; findings below may reference images not displayed]

FINDINGS: There is no evidence of fracture or dislocation. There is mild
calcaneal spurring. There is no evidence of arthropathy or other
focal bone abnormality. Soft tissues are unremarkable.
IMPRESSION: Mild calcaneal spurring without acute osseous abnormalities.

## 2020-11-02 ENCOUNTER — Encounter (HOSPITAL_BASED_OUTPATIENT_CLINIC_OR_DEPARTMENT_OTHER): Payer: Self-pay

## 2020-11-02 ENCOUNTER — Other Ambulatory Visit: Payer: Self-pay

## 2020-11-02 ENCOUNTER — Emergency Department (HOSPITAL_BASED_OUTPATIENT_CLINIC_OR_DEPARTMENT_OTHER)
Admission: EM | Admit: 2020-11-02 | Discharge: 2020-11-02 | Disposition: A | Discharge status: Home / Self Care | Payer: 59 | Attending: Emergency Medicine | Admitting: Emergency Medicine

## 2020-11-02 DIAGNOSIS — Z79899 Other long term (current) drug therapy: Secondary | ICD-10-CM | POA: Diagnosis not present

## 2020-11-02 DIAGNOSIS — L03213 Periorbital cellulitis: Secondary | ICD-10-CM

## 2020-11-02 DIAGNOSIS — H05011 Cellulitis of right orbit: Secondary | ICD-10-CM | POA: Insufficient documentation

## 2020-11-02 DIAGNOSIS — F17210 Nicotine dependence, cigarettes, uncomplicated: Secondary | ICD-10-CM | POA: Insufficient documentation

## 2020-11-02 DIAGNOSIS — I10 Essential (primary) hypertension: Secondary | ICD-10-CM | POA: Insufficient documentation

## 2020-11-02 DIAGNOSIS — R220 Localized swelling, mass and lump, head: Secondary | ICD-10-CM | POA: Diagnosis present

## 2020-11-02 MED ORDER — CLINDAMYCIN HCL 150 MG PO CAPS: 300.0000 mg | ORAL_CAPSULE | Freq: Three times a day (TID) | ORAL | 0 refills | Status: AC

## 2020-11-02 MED ORDER — AMOXICILLIN-POT CLAVULANATE 875-125 MG PO TABS: 1.0000 | ORAL_TABLET | Freq: Two times a day (BID) | ORAL | 0 refills | Status: AC

## 2020-11-02 NOTE — Discharge Instructions (Addendum)
Please pick up antibiotics and take as prescribed to cover for your infection  You can continue applying warm compresses to the area to help with inflammation. Take Ibuprofen and Tylenol as needed for pain.   It is recommended that you return to the ED in 48 hours for recheck of your symptoms or sooner if you develop any of the following symptoms: worsening pain, pain with eye movement, bulging of your eye, or no improvement after 2 days of antibiotic use

## 2020-11-02 NOTE — ED Notes (Signed)
Pain, swelling, redness R eye with tearing but no purulence.

## 2020-11-02 NOTE — ED Triage Notes (Addendum)
Pt c/o swelling to right side of face x 2 days-states he feels like started as a cyst to area last week-denies injury-redness/swelling noted under right eye-NAD-steady gait

## 2020-11-02 NOTE — ED Provider Notes (Signed)
[facility] EMERGENCY DEPARTMENT Provider Note   CSN: [id number] Arrival date & time: 11/02/20  1536     History Chief Complaint  Patient presents with   Facial Swelling    Johnathan Larson is a 40 y.o. male with Pmhx HTN who presents to the ED today with complaint of facial swelling that he noticed this morning.  He does admit that he noticed a small "bump" to his right cheek yesterday.  Patient denies squeezing this area however he does mention that he was rubbing his hand over it throughout the entire day trying to see if he could get it to go down.  This morning he woke up with swelling and redness around his eye.  He denies any specific eye pain or vision changes.  He states that his eye was swollen shut this morning.  He is unsure if he had any crusting to the area.  He states that he applied warm compresses which helped with the swelling.  He took Tylenol PM earlier however he denies any relief with same and states it only made him sleepy.  He has no other complaints at this time.  He does state that the bump that he noticed to his right cheek yesterday seems to be larger in size as well today.  The history is provided by the patient and medical records.      Past Medical History:  Diagnosis Date   Hypertension     There are no problems to display for this patient.   History reviewed. No pertinent surgical history.     No family history on file.  Social History   Tobacco Use   Smoking status: Every Day    Packs/day: 0.50    Types: Cigarettes   Smokeless tobacco: Never  Vaping Use   Vaping Use: Never used  Substance Use Topics   Alcohol use: Yes    Comment: occ   Drug use: No    Home Medications Prior to Admission medications   Medication Sig Start Date End Date Taking? Authorizing Provider  amoxicillin-clavulanate (AUGMENTIN) 875-125 MG tablet Take 1 tablet by mouth 2 (two) times daily for 7 days. 11/02/20 11/09/20 Yes Bryla Burek, PA-C   clindamycin (CLEOCIN) 150 MG capsule Take 2 capsules (300 mg total) by mouth 3 (three) times daily for 7 days. 11/02/20 11/09/20 Yes Corry Storie, PA-C  acetaminophen (TYLENOL) 500 MG tablet Take 1 tablet (500 mg total) by mouth every 6 (six) hours as needed. 05/09/17   Law, Waylan Boga, PA-C  amoxicillin (AMOXIL) 500 MG capsule Take 2 capsules (1,000 mg total) by mouth 2 (two) times daily. 01/02/18   Melene Plan, DO  chlorhexidine (PERIDEX) 0.12 % solution Use as directed 15 mLs in the mouth or throat 2 (two) times daily. Do NOT swallow. 12/27/17   Bill Salinas, PA-C  HYDROcodone-acetaminophen (NORCO/VICODIN) 5-325 MG tablet Take 1-2 tablets by mouth every 6 (six) hours as needed. 02/20/18   Gwyneth Sprout, MD  ibuprofen (ADVIL,MOTRIN) 800 MG tablet Take 1 tablet (800 mg total) by mouth every 8 (eight) hours as needed for moderate pain. 04/05/18   Long, Arlyss Repress, MD  lidocaine (XYLOCAINE) 2 % solution Use as directed 15 mLs in the mouth or throat as needed for mouth pain. Swish and spit.  Do not swallow. 12/11/17   Aviva Kluver B, PA-C  meloxicam (MOBIC) 15 MG tablet Take 1 tablet (15 mg total) by mouth daily. Take 1 daily with food. 08/28/17   Arthor Captain, PA-C  ondansetron (ZOFRAN ODT) 4 MG disintegrating tablet Take 1 tablet (4 mg total) by mouth every 8 (eight) hours as needed for nausea or vomiting. 01/30/18   Ward, Chase Picket, PA-C  potassium chloride SA (K-DUR,KLOR-CON) 20 MEQ tablet Take 1 tablet (20 mEq total) by mouth 2 (two) times daily for 4 days. 05/18/17 05/22/17  Long, Arlyss Repress, MD  traMADol (ULTRAM) 50 MG tablet Take 1 tablet (50 mg total) by mouth every 6 (six) hours as needed. 08/28/17   Arthor Captain, PA-C    Allergies    Patient has no known allergies.  Review of Systems   Review of Systems  Constitutional:  Negative for chills and fever.  HENT:  Positive for facial swelling.   Eyes:  Negative for pain, redness and visual disturbance.  All other systems reviewed  and are negative.  Physical Exam Updated Vital Signs BP (!) 165/103 (BP Location: Left Arm)   Pulse 62   Temp 98.3 F (36.8 C) (Oral)   Resp 16   Ht 5\' 9"  (1.753 m)   Wt 130.6 kg   SpO2 100%   BMI 42.53 kg/m   Physical Exam Vitals and nursing note reviewed.  Constitutional:      Appearance: He is not ill-appearing.  HENT:     Head: Atraumatic.      Comments: Right periorbital edema and erythema. 1 x 1 cm mobile mass/area of induration to right cheek just distal to the eye socket with TTP.  Conjunctiva clear. No injection. Mild clear drainage to eye.  EOMI. PERRL. No pain with extraocular movements.  No proptosis on exam.  Eyes:     Conjunctiva/sclera: Conjunctivae normal.  Cardiovascular:     Rate and Rhythm: Normal rate and regular rhythm.  Pulmonary:     Effort: Pulmonary effort is normal.     Breath sounds: Normal breath sounds.  Skin:    General: Skin is warm and dry.     Coloration: Skin is not jaundiced.  Neurological:     Mental Status: He is alert.    ED Results / Procedures / Treatments   Labs (all labs ordered are listed, but only abnormal results are displayed) Labs Reviewed - No data to display  EKG None  Radiology No results found.  Procedures Procedures   Medications Ordered in ED Medications - No data to display  ED Course  I have reviewed the triage vital signs and the nursing notes.  Pertinent labs & imaging results that were available during my care of the patient were reviewed by me and considered in my medical decision making (see chart for details).    MDM Rules/Calculators/A&P                           40 year old male who presents to the ED today with complaint of right-sided facial swelling began earlier today.  Patient reports he had a small pimple to his face yesterday that he had been manipulating and this morning woke up with swelling to the outer aspect of his eye.  No eye involvement.  No eye pain.  On arrival to the ED  vitals are stable.  On my exam patient appears to be in no acute distress.  He is noted to have periorbital edema and erythema with a 1 x 1 cm area of induration to his right cheek.  I do suspect that he has a skin infection causing some periorbital cellulitis.  His extraocular movements are intact  without any pain.  I am not concerned regarding orbital cellulitis at this time given recent facial trauma in the setting of acne and swelling. Will discharge patient at this time with antibiotics for same. He is instructed to return to the ED in 48 hours if no improvement in symptoms or should he develop worsening pain, pain with extraocular movements, or proptosis. Pt is in agreement with plan and stable for discharge home.   This note was prepared using Dragon voice recognition software and may include unintentional dictation errors due to the inherent limitations of voice recognition software.   Final Clinical Impression(s) / ED Diagnoses Final diagnoses:  Preseptal cellulitis of right eye    Rx / DC Orders ED Discharge Orders          Ordered    amoxicillin-clavulanate (AUGMENTIN) 875-125 MG tablet  2 times daily        11/02/20 1918    clindamycin (CLEOCIN) 150 MG capsule  3 times daily        11/02/20 1918             Discharge Instructions      Please pick up antibiotics and take as prescribed to cover for your infection  You can continue applying warm compresses to the area to help with inflammation. Take Ibuprofen and Tylenol as needed for pain.   It is recommended that you return to the ED in 48 hours for recheck of your symptoms or sooner if you develop any of the following symptoms: worsening pain, pain with eye movement, bulging of your eye, or no improvement after 2 days of antibiotic use       Tanda Rockers, PA-C 11/02/20 1921    Franne Forts, DO 11/03/20 1621

## 2021-02-08 ENCOUNTER — Other Ambulatory Visit: Payer: Self-pay

## 2021-02-08 ENCOUNTER — Other Ambulatory Visit (HOSPITAL_BASED_OUTPATIENT_CLINIC_OR_DEPARTMENT_OTHER): Payer: Self-pay

## 2021-02-08 ENCOUNTER — Encounter (HOSPITAL_BASED_OUTPATIENT_CLINIC_OR_DEPARTMENT_OTHER): Payer: Self-pay

## 2021-02-08 ENCOUNTER — Emergency Department (HOSPITAL_BASED_OUTPATIENT_CLINIC_OR_DEPARTMENT_OTHER)
Admission: EM | Admit: 2021-02-08 | Discharge: 2021-02-08 | Disposition: A | Discharge status: Home / Self Care | Payer: 59 | Attending: Emergency Medicine | Admitting: Emergency Medicine

## 2021-02-08 DIAGNOSIS — X500XXA Overexertion from strenuous movement or load, initial encounter: Secondary | ICD-10-CM | POA: Insufficient documentation

## 2021-02-08 DIAGNOSIS — Y92197 Garden or yard of other specified residential institution as the place of occurrence of the external cause: Secondary | ICD-10-CM | POA: Insufficient documentation

## 2021-02-08 DIAGNOSIS — I10 Essential (primary) hypertension: Secondary | ICD-10-CM | POA: Insufficient documentation

## 2021-02-08 DIAGNOSIS — M25551 Pain in right hip: Secondary | ICD-10-CM | POA: Diagnosis not present

## 2021-02-08 DIAGNOSIS — F17210 Nicotine dependence, cigarettes, uncomplicated: Secondary | ICD-10-CM | POA: Insufficient documentation

## 2021-02-08 MED ORDER — METHOCARBAMOL 500 MG PO TABS
500.0000 mg | ORAL_TABLET | Freq: Two times a day (BID) | ORAL | 0 refills | Status: DC
  Filled 2021-02-08: qty 20, 10d supply, fill #0

## 2021-02-08 MED ORDER — LIDOCAINE 4 % EX PTCH
1.0000 | MEDICATED_PATCH | CUTANEOUS | 0 refills | Status: AC
  Filled 2021-02-08: qty 5, 5d supply, fill #0

## 2021-02-08 NOTE — ED Triage Notes (Signed)
Pt arrives to ED with c/o pain in right hip radiating into leg starting last night.

## 2021-02-08 NOTE — ED Notes (Signed)
ED Provider at bedside. 

## 2021-02-08 NOTE — Discharge Instructions (Addendum)
I suspect your hip pain is irritation from overuse.  Please rest your leg with ice and elevation as you are able.  I have also given you prescription for muscle relaxer and a lidocaine patch as prescribed as needed for pain.  You may also take ibuprofen for additional pain management.  Return if development of any new or worsening symptoms.

## 2021-02-08 NOTE — ED Provider Notes (Signed)
[facility] EMERGENCY DEPARTMENT Provider Note   CSN: [id number] Arrival date & time: 02/08/21  5929     History Chief Complaint  Patient presents with   Leg Pain    Johnathan Larson is a 40 y.o. male.  Patient with history of hypertension presents today with right hip pain. States that same began when he woke up this morning.  Does endorse that he has significant amount of yard work with lifting yesterday, however denies any injuries.  Pain is located on the outside of the right hip, is sharp in nature, and radiates down to his knee.  He is able to ambulate with some pain.  States that last pain is triggered when he stands up or sits down.  Denies fevers, chills.  Does endorse that he smokes half a pack a day, does not drink alcohol, denies any recreational drug use or IVDU.  The history is provided by the patient. No language interpreter was used.  Leg Pain Associated symptoms: no back pain, no fever and no neck pain       Past Medical History:  Diagnosis Date   Hypertension     There are no problems to display for this patient.   History reviewed. No pertinent surgical history.     No family history on file.  Social History   Tobacco Use   Smoking status: Every Day    Packs/day: 0.50    Types: Cigarettes   Smokeless tobacco: Never  Vaping Use   Vaping Use: Never used  Substance Use Topics   Alcohol use: Yes    Comment: occ   Drug use: No    Home Medications Prior to Admission medications   Medication Sig Start Date End Date Taking? Authorizing Provider  acetaminophen (TYLENOL) 500 MG tablet Take 1 tablet (500 mg total) by mouth every 6 (six) hours as needed. 05/09/17   Law, Waylan Boga, PA-C  amoxicillin (AMOXIL) 500 MG capsule Take 2 capsules (1,000 mg total) by mouth 2 (two) times daily. 01/02/18   Melene Plan, DO  chlorhexidine (PERIDEX) 0.12 % solution Use as directed 15 mLs in the mouth or throat 2 (two) times daily. Do NOT swallow. 12/27/17    Bill Salinas, PA-C  HYDROcodone-acetaminophen (NORCO/VICODIN) 5-325 MG tablet Take 1-2 tablets by mouth every 6 (six) hours as needed. 02/20/18   Gwyneth Sprout, MD  ibuprofen (ADVIL,MOTRIN) 800 MG tablet Take 1 tablet (800 mg total) by mouth every 8 (eight) hours as needed for moderate pain. 04/05/18   Long, Arlyss Repress, MD  lidocaine (XYLOCAINE) 2 % solution Use as directed 15 mLs in the mouth or throat as needed for mouth pain. Swish and spit.  Do not swallow. 12/11/17   Aviva Kluver B, PA-C  meloxicam (MOBIC) 15 MG tablet Take 1 tablet (15 mg total) by mouth daily. Take 1 daily with food. 08/28/17   Harris, Cammy Copa, PA-C  ondansetron (ZOFRAN ODT) 4 MG disintegrating tablet Take 1 tablet (4 mg total) by mouth every 8 (eight) hours as needed for nausea or vomiting. 01/30/18   Ward, Chase Picket, PA-C  potassium chloride SA (K-DUR,KLOR-CON) 20 MEQ tablet Take 1 tablet (20 mEq total) by mouth 2 (two) times daily for 4 days. 05/18/17 05/22/17  Long, Arlyss Repress, MD  traMADol (ULTRAM) 50 MG tablet Take 1 tablet (50 mg total) by mouth every 6 (six) hours as needed. 08/28/17   Arthor Captain, PA-C    Allergies    Patient has no known allergies.  Review  of Systems   Review of Systems  Constitutional:  Negative for chills and fever.  Musculoskeletal:  Positive for arthralgias and myalgias. Negative for back pain, gait problem, joint swelling, neck pain and neck stiffness.  Skin:  Negative for rash and wound.  All other systems reviewed and are negative.  Physical Exam Updated Vital Signs BP (!) 154/98 (BP Location: Left Arm)    Pulse 79    Temp 98.1 F (36.7 C)    Resp 16    Ht 5\' 8"  (1.727 m)    Wt 128.8 kg    SpO2 98%    BMI 43.18 kg/m   Physical Exam Vitals and nursing note reviewed.  Constitutional:      Appearance: Normal appearance. He is obese.  Cardiovascular:     Rate and Rhythm: Normal rate.  Pulmonary:     Effort: Pulmonary effort is normal. No respiratory distress.  Abdominal:      General: Abdomen is flat.     Palpations: Abdomen is soft.  Musculoskeletal:     Comments: Patient observed to be ambulatory with some pain. Tenderness to palpation of the lateral aspect of the right hip. No bruising or deformity noted  No midline tenderness, step-offs, deformity noted to cervical, thoracic, or lumbar spine.  Skin:    General: Skin is warm and dry.  Neurological:     General: No focal deficit present.     Mental Status: He is alert.    ED Results / Procedures / Treatments   Labs (all labs ordered are listed, but only abnormal results are displayed) Labs Reviewed - No data to display  EKG None  Radiology No results found.  Procedures Procedures   Medications Ordered in ED Medications - No data to display  ED Course  I have reviewed the triage vital signs and the nursing notes.  Pertinent labs & imaging results that were available during my care of the patient were reviewed by me and considered in my medical decision making (see chart for details).    MDM Rules/Calculators/A&P                         Patient presents with right hip pain since this morning. No injury, no fevers or chills, no red flag symptoms. Full ROM noted to the right hip with some pain. Given lack of specific injury, doubt fracture or dislocation, no imaging indicated at this time.  He is afebrile, nontoxic-appearing, and in no acute distress. Hip is not warm or erythematous, doubt septic joint. Suspect pain is due to muscle strain from overuse vs trochanteric bursitis.  Educated of same.  Will manage with muscle relaxers and anti-inflammatories with follow up with orthopedics if symptoms persist for re-evaluation and management. Conservative therapy recommended and discussed. Patient will be dc home & is agreeable with above plan.  Discharged in stable condition.   Final Clinical Impression(s) / ED Diagnoses Final diagnoses:  Right hip pain    Rx / DC Orders ED Discharge Orders           Ordered    methocarbamol (ROBAXIN) 500 MG tablet  2 times daily        02/08/21 1223    Lidocaine (HM LIDOCAINE PATCH) 4 % PTCH  1 Day/Dose        02/08/21 1223          An After Visit Summary was printed and given to the patient.    Alvin Rubano, 02/10/21,  PA-C 02/08/21 1225    Terrilee Files, MD 02/08/21 [phone]

## 2021-02-09 ENCOUNTER — Encounter (HOSPITAL_BASED_OUTPATIENT_CLINIC_OR_DEPARTMENT_OTHER): Payer: Self-pay | Admitting: Emergency Medicine

## 2021-02-09 ENCOUNTER — Emergency Department (HOSPITAL_BASED_OUTPATIENT_CLINIC_OR_DEPARTMENT_OTHER)
Admission: EM | Admit: 2021-02-09 | Discharge: 2021-02-09 | Disposition: A | Discharge status: Home / Self Care | Payer: 59 | Attending: Emergency Medicine | Admitting: Emergency Medicine

## 2021-02-09 ENCOUNTER — Emergency Department (HOSPITAL_BASED_OUTPATIENT_CLINIC_OR_DEPARTMENT_OTHER): Payer: 59

## 2021-02-09 DIAGNOSIS — M25551 Pain in right hip: Secondary | ICD-10-CM

## 2021-02-09 DIAGNOSIS — M5431 Sciatica, right side: Secondary | ICD-10-CM | POA: Diagnosis not present

## 2021-02-09 DIAGNOSIS — F17210 Nicotine dependence, cigarettes, uncomplicated: Secondary | ICD-10-CM | POA: Diagnosis not present

## 2021-02-09 DIAGNOSIS — I10 Essential (primary) hypertension: Secondary | ICD-10-CM | POA: Insufficient documentation

## 2021-02-09 MED ORDER — KETOROLAC TROMETHAMINE 30 MG/ML IJ SOLN
30.0000 mg | Freq: Once | INTRAMUSCULAR | Status: AC
  Administered 2021-02-09: 09:00:00 30 mg via INTRAMUSCULAR
  Filled 2021-02-09: qty 1

## 2021-02-09 MED ORDER — DEXAMETHASONE SODIUM PHOSPHATE 10 MG/ML IJ SOLN
10.0000 mg | Freq: Once | INTRAMUSCULAR | Status: AC
  Administered 2021-02-09: 09:00:00 10 mg via INTRAMUSCULAR
  Filled 2021-02-09: qty 1

## 2021-02-09 NOTE — Discharge Instructions (Signed)
You are seen in the emergency room today with right hip and back pain which seems most consistent with sciatica.  This should improve over the next several days.  I am taking out of work to allow for rest.  He may take Tylenol and ibuprofen over-the-counter as well as other medications given in the emergency department yesterday.  Please return with any new or suddenly worsening symptoms such as numbness, weakness, inability to urinate, or other sudden/severe symptoms.

## 2021-02-09 NOTE — ED Provider Notes (Signed)
Emergency Department Provider Note   I have reviewed the triage vital signs and the nursing notes.   HISTORY  Chief Complaint Leg Pain   HPI Johnathan Larson is a 40 y.o. male past medical history of hypertension presents to the emergency department with right hip and buttock pain rating down the right leg.  He was seen in the emergency department yesterday but reports pain is worsening especially this morning.  Pain was bad especially when he is up and trying to walk.  He does get some relief with rest.  He feels pain radiating into the back of the upper thigh and hip area.  Denies any injury.  No numbness or weakness in the leg.  No bowel or bladder incontinence.  No groin numbness.  No fevers.    Past Medical History:  Diagnosis Date   Hypertension     There are no problems to display for this patient.   History reviewed. No pertinent surgical history.  Allergies Patient has no known allergies.  History reviewed. No pertinent family history.  Social History Social History   Tobacco Use   Smoking status: Every Day    Packs/day: 0.50    Types: Cigarettes   Smokeless tobacco: Never  Vaping Use   Vaping Use: Never used  Substance Use Topics   Alcohol use: Yes    Comment: occ   Drug use: No    Review of Systems  Constitutional: No fever/chills Eyes: No visual changes. ENT: No sore throat. Cardiovascular: Denies chest pain. Respiratory: Denies shortness of breath. Gastrointestinal: No abdominal pain.  No nausea, no vomiting.  No diarrhea.  No constipation. Musculoskeletal: Negative for back pain.  Positive right hip/buttock pain rating down the right thigh. Skin: Negative for rash. Neurological: Negative for headaches, focal weakness or numbness.  10-point ROS otherwise negative.  ____________________________________________   PHYSICAL EXAM:  VITAL SIGNS: ED Triage Vitals  Enc Vitals Group     BP 02/09/21 0805 (!) 175/108     Pulse Rate 02/09/21  0805 75     Resp 02/09/21 0805 18     Temp 02/09/21 0805 98.1 F (36.7 C)     Temp Source 02/09/21 0805 Oral     SpO2 02/09/21 0805 100 %     Weight 02/09/21 0804 284 lb (128.8 kg)     Height 02/09/21 0804 5\' 8"  (1.727 m)   Constitutional: Alert and oriented. Well appearing and in no acute distress. Eyes: Conjunctivae are normal.  Head: Atraumatic. Nose: No congestion/rhinnorhea. Mouth/Throat: Mucous membranes are moist. Neck: No stridor.   Cardiovascular: Normal rate, regular rhythm. Good peripheral circulation. Grossly normal heart sounds.   Respiratory: Normal respiratory effort.  No retractions. Lungs CTAB. Gastrointestinal: Soft and nontender. No distention.  Musculoskeletal: Normal range of motion of the right hip with some pain on passive range of motion.  No tenderness over the knee or ankle.  Normal perfusion in the lower extremities with intact sensation.  Normal strength.  Neurologic:  Normal speech and language. No gross focal neurologic deficits are appreciated.  Skin:  Skin is warm, dry and intact. No rash noted. ____________________________________________  RADIOLOGY  DG Hip Unilat W or Wo Pelvis 2-3 Views Right  Result Date: 02/09/2021 CLINICAL DATA:  Right hip pain EXAM: DG HIP (WITH OR WITHOUT PELVIS) 2-3V RIGHT COMPARISON:  None. FINDINGS: There is no evidence of hip fracture or dislocation. There is no evidence of arthropathy or other focal bone abnormality. IMPRESSION: Negative. Electronically Signed   By: 02/11/2021  [place] M.D.   On: 02/09/2021 08:54    ____________________________________________   PROCEDURES  Procedure(s) performed:   Procedures  None  ____________________________________________   INITIAL IMPRESSION / ASSESSMENT AND PLAN / ED COURSE  Pertinent labs & imaging results that were available during my care of the patient were reviewed by me and considered in my medical decision making (see chart for details).   Presents emergency  department for evaluation of right hip pain.  No particular injury which prompted symptoms.  The patient has no films for review from his ED visit yesterday.  Do plan for right hip pain as is appears to be mainly where he is having discomfort.  No ankle or knee symptoms.  No neurodeficits to suspect acute cord compression, bleeding, infection.  No ischemic signs or other symptoms to suspect this is the etiology of his pain.  Clinically, seems most consistent with sciatica.  Plan for Decadron and Toradol along with plain film of the right hip.   Film of the right hip reviewed showing no acute fracture or dislocation.  Patient feeling improved after Toradol here.  Also given Decadron with presumed sciatica.  We will continue over-the-counter medications.  We will provide a work note.  Discussed PCP follow-up and ED return precautions. ____________________________________________  FINAL CLINICAL IMPRESSION(S) / ED DIAGNOSES  Final diagnoses:  Right hip pain  Sciatica of right side     MEDICATIONS GIVEN DURING THIS VISIT:  Medications  dexamethasone (DECADRON) injection 10 mg (10 mg Intramuscular Given 02/09/21 0848)  ketorolac (TORADOL) 30 MG/ML injection 30 mg (30 mg Intramuscular Given 02/09/21 0846)    Note:  This document was prepared using Dragon voice recognition software and may include unintentional dictation errors.  Alona Bene, MD, [facility]    Avion Kutzer, Arlyss Repress, MD 02/09/21 [phone]

## 2021-02-09 NOTE — ED Triage Notes (Signed)
Pt arrives pov with c/o right leg pain x 3 days, increases with ambulation. Treated yesterday in ED for same.

## 2021-02-11 ENCOUNTER — Ambulatory Visit (HOSPITAL_BASED_OUTPATIENT_CLINIC_OR_DEPARTMENT_OTHER)
Admission: RE | Admit: 2021-02-11 | Discharge: 2021-02-11 | Disposition: A | Discharge status: Home / Self Care | Payer: 59 | Source: Ambulatory Visit | Attending: Family Medicine | Admitting: Family Medicine

## 2021-02-11 ENCOUNTER — Encounter: Payer: Self-pay | Admitting: Family Medicine

## 2021-02-11 ENCOUNTER — Other Ambulatory Visit: Payer: Self-pay

## 2021-02-11 ENCOUNTER — Other Ambulatory Visit (HOSPITAL_BASED_OUTPATIENT_CLINIC_OR_DEPARTMENT_OTHER): Payer: Self-pay

## 2021-02-11 ENCOUNTER — Ambulatory Visit (INDEPENDENT_AMBULATORY_CARE_PROVIDER_SITE_OTHER): Payer: 59 | Admitting: Family Medicine

## 2021-02-11 VITALS — Ht 68.0 in | Wt 284.0 lb

## 2021-02-11 DIAGNOSIS — M5416 Radiculopathy, lumbar region: Secondary | ICD-10-CM | POA: Diagnosis present

## 2021-02-11 MED ORDER — HYDROCODONE-ACETAMINOPHEN 5-325 MG PO TABS
1.0000 | ORAL_TABLET | Freq: Three times a day (TID) | ORAL | 0 refills | Status: DC | PRN
  Filled 2021-02-11: qty 15, 5d supply, fill #0

## 2021-02-11 MED ORDER — GABAPENTIN 300 MG PO CAPS
300.0000 mg | ORAL_CAPSULE | Freq: Three times a day (TID) | ORAL | 1 refills | Status: DC
  Filled 2021-02-11: qty 90, 30d supply, fill #0

## 2021-02-11 MED ORDER — MELOXICAM 7.5 MG PO TABS
7.5000 mg | ORAL_TABLET | Freq: Two times a day (BID) | ORAL | 1 refills | Status: DC | PRN
  Filled 2021-02-11: qty 45, 23d supply, fill #0

## 2021-02-11 NOTE — Assessment & Plan Note (Signed)
Acutely occurring.  Symptoms most consistent with radicular type pain. -Counseled on home exercise therapy and supportive care. -Mobic. -Gabapentin. -Norco. -X-ray. -Could consider physical therapy or further imaging.

## 2021-02-11 NOTE — Patient Instructions (Signed)
Nice to meet you  Please try heat  Please try the mobic  Please use the norco as needed for severe pain  Please start the gabapentin at night. You can increase to 2 or 3 times daily as you tolerate.  I will call with the xray results.  Please send me a message in MyChart with any questions or updates.  Please see me back in 1 week.   --Dr. Jordan Likes

## 2021-02-11 NOTE — Progress Notes (Signed)
°  Johnathan Larson - 40 y.o. male MRN [id number]  Date of birth: [birth year]  SUBJECTIVE:  Including CC & ROS.  No chief complaint on file.   Johnathan Larson is a 40 y.o. male that is presenting with acute right leg pain.  The pain has been ongoing for about 4 days.  It is severe in nature.  It is constant.  No history of similar pain.  No improvement with modalities today..  Independent review of the right hip x-ray from 12/20 shows no acute changes.   Review of Systems See HPI   HISTORY: Past Medical, Surgical, Social, and Family History Reviewed & Updated per EMR.   Pertinent Historical Findings include:  Past Medical History:  Diagnosis Date   Hypertension     History reviewed. No pertinent surgical history.  History reviewed. No pertinent family history.  Social History   Socioeconomic History   Marital status: Single    Spouse name: Not on file   Number of children: Not on file   Years of education: Not on file   Highest education level: Not on file  Occupational History   Not on file  Tobacco Use   Smoking status: Every Day    Packs/day: 0.50    Types: Cigarettes   Smokeless tobacco: Never  Vaping Use   Vaping Use: Never used  Substance and Sexual Activity   Alcohol use: Yes    Comment: occ   Drug use: No   Sexual activity: Not on file  Other Topics Concern   Not on file  Social History Narrative   Not on file   Social Determinants of Health   Financial Resource Strain: Not on file  Food Insecurity: Not on file  Transportation Needs: Not on file  Physical Activity: Not on file  Stress: Not on file  Social Connections: Not on file  Intimate Partner Violence: Not on file     PHYSICAL EXAM:  VS: Ht 5\' 8"  (1.727 m)    Wt 284 lb (128.8 kg)    BMI 43.18 kg/m  Physical Exam Gen: NAD, alert, cooperative with exam, well-appearing   ASSESSMENT & PLAN:   Lumbar radiculopathy Acutely occurring.  Symptoms most consistent with radicular type  pain. -Counseled on home exercise therapy and supportive care. -Mobic. -Gabapentin. -Norco. -X-ray. -Could consider physical therapy or further imaging.

## 2021-02-16 ENCOUNTER — Telehealth: Payer: Self-pay | Admitting: Family Medicine

## 2021-02-16 NOTE — Telephone Encounter (Signed)
Informed of results.   Myra Rude, MD [facility] 02/16/2021, 12:28 PM

## 2021-02-18 ENCOUNTER — Encounter: Payer: Self-pay | Admitting: Family Medicine

## 2021-02-18 ENCOUNTER — Ambulatory Visit (INDEPENDENT_AMBULATORY_CARE_PROVIDER_SITE_OTHER): Payer: 59 | Admitting: Family Medicine

## 2021-02-18 ENCOUNTER — Ambulatory Visit: Payer: 59 | Admitting: Family Medicine

## 2021-02-18 DIAGNOSIS — M5416 Radiculopathy, lumbar region: Secondary | ICD-10-CM | POA: Diagnosis not present

## 2021-02-18 NOTE — Patient Instructions (Signed)
Good to see you Happy bel[birth year] birthday  Please continue the exercises and heat   Please send me a message in MyChart with any questions or updates.  Please see me back in 3-4 weeks.   --Dr. Jordan Likes

## 2021-02-18 NOTE — Assessment & Plan Note (Signed)
Still acutely in pain.  He is to function more normally with the medications. -Counseled on home exercise therapy and supportive care. -Provided work note. -Could consider physical therapy.

## 2021-02-18 NOTE — Progress Notes (Signed)
°  Johnathan Larson - 40 y.o. male MRN [id number]  Date of birth: 10/06/1980  SUBJECTIVE:  Including CC & ROS.  No chief complaint on file.   Johnathan Larson is a 40 y.o. male that is following up for his radicular pain.  He is still having pain but does get improvement slightly with the medications.   Review of Systems See HPI   HISTORY: Past Medical, Surgical, Social, and Family History Reviewed & Updated per EMR.   Pertinent Historical Findings include:  Past Medical History:  Diagnosis Date   Hypertension     History reviewed. No pertinent surgical history.  History reviewed. No pertinent family history.  Social History   Socioeconomic History   Marital status: Single    Spouse name: Not on file   Number of children: Not on file   Years of education: Not on file   Highest education level: Not on file  Occupational History   Not on file  Tobacco Use   Smoking status: Every Day    Packs/day: 0.50    Types: Cigarettes   Smokeless tobacco: Never  Vaping Use   Vaping Use: Never used  Substance and Sexual Activity   Alcohol use: Yes    Comment: occ   Drug use: No   Sexual activity: Not on file  Other Topics Concern   Not on file  Social History Narrative   Not on file   Social Determinants of Health   Financial Resource Strain: Not on file  Food Insecurity: Not on file  Transportation Needs: Not on file  Physical Activity: Not on file  Stress: Not on file  Social Connections: Not on file  Intimate Partner Violence: Not on file     PHYSICAL EXAM:  VS: BP 140/88 (BP Location: Left Arm, Patient Position: Sitting)    Ht 5\' 8"  (1.727 m)    Wt 284 lb (128.8 kg)    BMI 43.18 kg/m  Physical Exam Gen: NAD, alert, cooperative with exam, well-appearing    ASSESSMENT & PLAN:   Lumbar radiculopathy Still acutely in pain.  He is to function more normally with the medications. -Counseled on home exercise therapy and supportive care. -Provided work note. -Could  consider physical therapy.

## 2021-05-05 ENCOUNTER — Emergency Department (HOSPITAL_BASED_OUTPATIENT_CLINIC_OR_DEPARTMENT_OTHER)
Admission: EM | Admit: 2021-05-05 | Discharge: 2021-05-05 | Disposition: A | Discharge status: Home / Self Care | Payer: BC Managed Care – PPO | Attending: Emergency Medicine | Admitting: Emergency Medicine

## 2021-05-05 ENCOUNTER — Other Ambulatory Visit (HOSPITAL_BASED_OUTPATIENT_CLINIC_OR_DEPARTMENT_OTHER): Payer: Self-pay

## 2021-05-05 ENCOUNTER — Other Ambulatory Visit: Payer: Self-pay

## 2021-05-05 ENCOUNTER — Encounter (HOSPITAL_BASED_OUTPATIENT_CLINIC_OR_DEPARTMENT_OTHER): Payer: Self-pay | Admitting: Emergency Medicine

## 2021-05-05 DIAGNOSIS — R1084 Generalized abdominal pain: Secondary | ICD-10-CM | POA: Insufficient documentation

## 2021-05-05 DIAGNOSIS — R197 Diarrhea, unspecified: Secondary | ICD-10-CM | POA: Diagnosis not present

## 2021-05-05 DIAGNOSIS — R519 Headache, unspecified: Secondary | ICD-10-CM | POA: Insufficient documentation

## 2021-05-05 DIAGNOSIS — R112 Nausea with vomiting, unspecified: Secondary | ICD-10-CM | POA: Diagnosis present

## 2021-05-05 LAB — COMPREHENSIVE METABOLIC PANEL
ALT: 28 U/L (ref 0–44)
AST: 28 U/L (ref 15–41)
Albumin: 4.4 g/dL (ref 3.5–5.0)
Alkaline Phosphatase: 69 U/L (ref 38–126)
Anion gap: 9 (ref 5–15)
BUN: 10 mg/dL (ref 6–20)
CO2: 24 mmol/L (ref 22–32)
Calcium: 9.5 mg/dL (ref 8.9–10.3)
Chloride: 105 mmol/L (ref 98–111)
Creatinine, Ser: 0.82 mg/dL (ref 0.61–1.24)
GFR, Estimated: >60 mL/min (ref >60)
Glucose, Bld: 97 mg/dL (ref 70–99)
Potassium: 3.9 mmol/L (ref 3.5–5.1)
Sodium: 138 mmol/L (ref 135–145)
Total Bilirubin: 0.6 mg/dL (ref 0.3–1.2)
Total Protein: 7.9 g/dL (ref 6.5–8.1)

## 2021-05-05 LAB — URINALYSIS, ROUTINE W REFLEX MICROSCOPIC
Bilirubin Urine: NEGATIVE
Glucose, UA: NEGATIVE mg/dL
Hgb urine dipstick: NEGATIVE
Ketones, ur: NEGATIVE mg/dL
Leukocytes,Ua: NEGATIVE
Nitrite: NEGATIVE
Protein, ur: NEGATIVE mg/dL
Specific Gravity, Urine: 1.025 (ref 1.005–1.030)
pH: 6 (ref 5.0–8.0)

## 2021-05-05 LAB — CBC
HCT: 45.8 % (ref 39.0–52.0)
Hemoglobin: 16.2 g/dL (ref 13.0–17.0)
MCH: 32.5 pg (ref 26.0–34.0)
MCHC: 35.4 g/dL (ref 30.0–36.0)
MCV: 91.8 fL (ref 80.0–100.0)
Platelets: 217 10*3/uL (ref 150–400)
RBC: 4.99 MIL/uL (ref 4.22–5.81)
RDW: 13 % (ref 11.5–15.5)
WBC: 7.4 10*3/uL (ref 4.0–10.5)
nRBC: 0 % (ref 0.0–0.2)

## 2021-05-05 LAB — LIPASE, BLOOD: Lipase: 33 U/L (ref 11–51)

## 2021-05-05 MED ORDER — ONDANSETRON 4 MG PO TBDP
4.0000 mg | ORAL_TABLET | Freq: Once | ORAL | Status: AC | PRN
  Administered 2021-05-05: 4 mg via ORAL
  Filled 2021-05-05: qty 1

## 2021-05-05 MED ORDER — ONDANSETRON HCL 4 MG PO TABS
4.0000 mg | ORAL_TABLET | Freq: Four times a day (QID) | ORAL | 0 refills | Status: DC
  Filled 2021-05-05: qty 12, 3d supply, fill #0

## 2021-05-05 NOTE — ED Provider Notes (Signed)
?[facility] ?Provider Note ? ? ?CSN: [id number] ?Arrival date & time: 05/05/21  1231 ? ?  ? ?History ? ?Chief Complaint  ?Patient presents with  ? Vomiting  ? ? ?Johnathan Larson is a 41 y.o. male who presents to the emergency department complaint of nausea, vomiting, diarrhea onset this morning.  Denies sick contacts.  Denies any new foods or medications.  Has associated headache.  Has not tried medication for his symptoms.  Denies fever, chills, dysuria, hematuria, rhinorrhea, nasal congestion. ? ?The history is provided by the patient. No language interpreter was used.  ? ?  ? ?Home Medications ?Prior to Admission medications   ?Medication Sig Start Date End Date Taking? Authorizing Provider  ?ondansetron (ZOFRAN) 4 MG tablet Take 1 tablet (4 mg total) by mouth every 6 (six) hours. 05/05/21  Yes Kabeer Hoagland A, PA-C  ?amoxicillin (AMOXIL) 500 MG capsule Take 2 capsules (1,000 mg total) by mouth 2 (two) times daily. 01/02/18   Deno Etienne, DO  ?chlorhexidine (PERIDEX) 0.12 % solution Use as directed 15 mLs in the mouth or throat 2 (two) times daily. Do NOT swallow. 12/27/17   Nuala Alpha A, PA-C  ?gabapentin (NEURONTIN) 300 MG capsule Take 1 capsule (300 mg total) by mouth 3 (three) times daily. 02/11/21   Rosemarie Ax, MD  ?HYDROcodone-acetaminophen (NORCO/VICODIN) 5-325 MG tablet Take 1 tablet by mouth every 8 (eight) hours as needed. 02/11/21   Rosemarie Ax, MD  ?lidocaine (XYLOCAINE) 2 % solution Use as directed 15 mLs in the mouth or throat as needed for mouth pain. Swish and spit.  Do not swallow. 12/11/17   Langston Masker B, PA-C  ?meloxicam (MOBIC) 7.5 MG tablet Take 1 tablet (7.5 mg total) by mouth 2 (two) times daily as needed. 02/11/21   Rosemarie Ax, MD  ?methocarbamol (ROBAXIN) 500 MG tablet Take 1 tablet (500 mg total) by mouth 2 (two) times daily. 02/08/21   Smoot, Leary Roca, PA-C  ?ondansetron (ZOFRAN ODT) 4 MG disintegrating tablet Take 1 tablet (4 mg  total) by mouth every 8 (eight) hours as needed for nausea or vomiting. 01/30/18   Ward, Ozella Almond, PA-C  ?potassium chloride SA (K-DUR,KLOR-CON) 20 MEQ tablet Take 1 tablet (20 mEq total) by mouth 2 (two) times daily for 4 days. 05/18/17 05/22/17  Long, Wonda Olds, MD  ?   ? ?Allergies    ?Patient has no known allergies.   ? ?Review of Systems   ?Review of Systems  ?Constitutional:  Negative for chills and fever.  ?HENT:  Negative for congestion and rhinorrhea.   ?Gastrointestinal:  Positive for abdominal pain, diarrhea, nausea and vomiting.  ?Genitourinary:  Negative for dysuria and hematuria.  ?All other systems reviewed and are negative. ? ?Physical Exam ?Updated Vital Signs ?BP (!) 129/59 (BP Location: Right Arm)   Pulse 69   Temp 98 ?F (36.7 ?C) (Oral)   Resp 16   Ht 5\' 9"  (1.753 m)   Wt 124.7 kg   SpO2 100%   BMI 40.61 kg/m?  ?Physical Exam ?Vitals and nursing note reviewed.  ?Constitutional:   ?   General: He is not in acute distress. ?   Appearance: He is not diaphoretic.  ?HENT:  ?   Head: Normocephalic and atraumatic.  ?   Mouth/Throat:  ?   Mouth: Mucous membranes are moist.  ?   Pharynx: Oropharynx is clear. Uvula midline. No oropharyngeal exudate, posterior oropharyngeal erythema or uvula swelling.  ?   Tonsils:  No tonsillar exudate.  ?Eyes:  ?   General: No scleral icterus. ?   Conjunctiva/sclera: Conjunctivae normal.  ?Cardiovascular:  ?   Rate and Rhythm: Normal rate and regular rhythm.  ?   Pulses: Normal pulses.  ?   Heart sounds: Normal heart sounds.  ?Pulmonary:  ?   Effort: Pulmonary effort is normal. No respiratory distress.  ?   Breath sounds: Normal breath sounds. No wheezing.  ?Abdominal:  ?   General: Bowel sounds are normal.  ?   Palpations: Abdomen is soft. There is no mass.  ?   Tenderness: There is no abdominal tenderness. There is no guarding or rebound.  ?   Comments: Minimal diffuse abdominal tenderness to palpation.  ?Musculoskeletal:     ?   General: Normal range of motion.   ?   Cervical back: Normal range of motion and neck supple.  ?Skin: ?   General: Skin is warm and dry.  ?Neurological:  ?   Mental Status: He is alert.  ?Psychiatric:     ?   Behavior: Behavior normal.  ? ? ?ED Results / Procedures / Treatments   ?Labs ?(all labs ordered are listed, but only abnormal results are displayed) ?Labs Reviewed  ?LIPASE, BLOOD  ?COMPREHENSIVE METABOLIC PANEL  ?CBC  ?URINALYSIS, ROUTINE W REFLEX MICROSCOPIC  ? ? ?EKG ?None ? ?Radiology ?No results found. ? ?Procedures ?Procedures  ? ? ?Medications Ordered in ED ?Medications  ?ondansetron (ZOFRAN-ODT) disintegrating tablet 4 mg (4 mg Oral Given 05/05/21 1257)  ? ? ?ED Course/ Medical Decision Making/ A&P ?Clinical Course as of 05/06/21 0930  ?Wed May 05, 2021  ?1445 Shared decision making with patient regarding treatment in the ED with IV fluids and possible CT scan.  However, patient notes at this time that Zofran has alleviated his symptoms and he he believes his symptoms are due to eating a new food the night prior.  I agree with patient due to negative lab work-up, patient afebrile, not tachycardic or hypoxic at this time.  Patient overall well-appearing and symptoms improved with Zofran.  Agree with patient less likely concern for intra-abdominal acute process at this time.  Will p.o. challenge patient prior to discharge.  Patient agreeable at this time. [SB]  ?45 Notified by RN that patient tolerated p.o. fluids.  We will send prescription Zofran to patient's pharmacy.  Patient appears safe for discharge. [SB]  ?  ?Clinical Course User Index ?[SB] Zadok Holaway A, PA-C  ? ?                        ?Medical Decision Making ?Amount and/or Complexity of Data Reviewed ?Labs: ordered. ? ?Risk ?Prescription drug management. ? ? ?Patient presents to the emergency department with nausea, vomiting, diarrhea (watery and soft stools) onset this morning.  Denies sick contacts.  Denies any new medications.  Vital signs stable, patient afebrile, not  tachycardic or hypoxic.  On exam patient with diffuse abdominal tenderness to palpation, no focal abdominal pain.  Remainder of exam without acute findings.  Differential diagnosis includes pancreatitis, cholecystitis, appendicitis, acute cystitis, viral etiology.   ? ?Labs:  ?I ordered, and personally interpreted labs.  The pertinent results include: Lipase at 33 and unremarkable. ?CMP overall unremarkable. ?CBC overall unremarkable. ?Urinalysis unremarkable. ? ?Medications:  ?I ordered medication including zofran for symptom management.  ?Reevaluation of the patient after these medicines and interventions, I reevaluated the patient and found that they have improved ?I have reviewed the  patients home medicines and have made adjustments as needed ?Tolerated PO challenge in ED with above treatment regimen  ? ?Disposition: ?Patient presentation suspicious for viral etiology of acute nausea, vomiting, diarrhea.  Doubt pancreatitis, lipase negative unremarkable, patient without left upper quadrant focal tenderness to palpation on exam.  Doubt cholecystitis, patient afebrile, without focal right upper quadrant tenderness to palpation on exam.  Doubt appendicitis, patient afebrile, symptoms improved with Zofran and no focal right lower quadrant or umbilical tenderness to palpation on exam.  Doubt acute cystitis, urinalysis unremarkable today. After consideration of the diagnostic results and the patients response to treatment, I feel that the patient would benefit from Discharge home.  Discharge home with prescription Zofran.  Discussed with patient importance of following up with primary care provider as needed. Supportive care measures and strict return precautions discussed with patient at bedside. Pt acknowledges and verbalizes understanding. Pt appears safe for discharge. Follow up as indicated in discharge paperwork.  ? ?This chart was dictated using voice recognition software, Dragon. Despite the best efforts of  this provider to proofread and correct errors, errors may still occur which can change documentation meaning. ? ? ?Final Clinical Impression(s) / ED Diagnoses ?Final diagnoses:  ?Nausea and vomiting, unspecifi

## 2021-05-05 NOTE — Discharge Instructions (Signed)
It was a pleasure taking care of you today! ? ?Your labs are unremarkable.  You will be sent a prescription for Zofran, take as prescribed.  You may follow-up with your primary care provider as needed.  Ensure to maintain fluid intake with small sips of tea, soup, broth, Pedialyte, Gatorade, water, popsicles.  Return to the emergency department for experiencing increasing/worsening abdominal pain, inability to keep fluids down, fever, worsening symptoms. ?

## 2021-05-05 NOTE — ED Triage Notes (Signed)
Reports  n/v/d that started this morning. ?

## 2021-05-13 ENCOUNTER — Other Ambulatory Visit (HOSPITAL_BASED_OUTPATIENT_CLINIC_OR_DEPARTMENT_OTHER): Payer: Self-pay

## 2021-07-09 ENCOUNTER — Emergency Department (HOSPITAL_BASED_OUTPATIENT_CLINIC_OR_DEPARTMENT_OTHER): Payer: BC Managed Care – PPO

## 2021-07-09 ENCOUNTER — Emergency Department (HOSPITAL_BASED_OUTPATIENT_CLINIC_OR_DEPARTMENT_OTHER)
Admission: EM | Admit: 2021-07-09 | Discharge: 2021-07-09 | Disposition: A | Discharge status: Home / Self Care | Payer: BC Managed Care – PPO | Attending: Emergency Medicine | Admitting: Emergency Medicine

## 2021-07-09 ENCOUNTER — Encounter (HOSPITAL_BASED_OUTPATIENT_CLINIC_OR_DEPARTMENT_OTHER): Payer: Self-pay

## 2021-07-09 ENCOUNTER — Other Ambulatory Visit: Payer: Self-pay

## 2021-07-09 DIAGNOSIS — R7309 Other abnormal glucose: Secondary | ICD-10-CM | POA: Insufficient documentation

## 2021-07-09 DIAGNOSIS — R202 Paresthesia of skin: Secondary | ICD-10-CM | POA: Diagnosis not present

## 2021-07-09 DIAGNOSIS — R208 Other disturbances of skin sensation: Secondary | ICD-10-CM | POA: Diagnosis present

## 2021-07-09 DIAGNOSIS — M545 Low back pain, unspecified: Secondary | ICD-10-CM | POA: Insufficient documentation

## 2021-07-09 DIAGNOSIS — M79671 Pain in right foot: Secondary | ICD-10-CM | POA: Diagnosis not present

## 2021-07-09 DIAGNOSIS — I10 Essential (primary) hypertension: Secondary | ICD-10-CM | POA: Insufficient documentation

## 2021-07-09 LAB — BASIC METABOLIC PANEL
Anion gap: 6 (ref 5–15)
BUN: 9 mg/dL (ref 6–20)
CO2: 26 mmol/L (ref 22–32)
Calcium: 9.3 mg/dL (ref 8.9–10.3)
Chloride: 107 mmol/L (ref 98–111)
Creatinine, Ser: 0.89 mg/dL (ref 0.61–1.24)
GFR, Estimated: >60 mL/min (ref >60)
Glucose, Bld: 96 mg/dL (ref 70–99)
Potassium: 3.9 mmol/L (ref 3.5–5.1)
Sodium: 139 mmol/L (ref 135–145)

## 2021-07-09 LAB — CBC WITH DIFFERENTIAL/PLATELET
Abs Immature Granulocytes: 0.01 10*3/uL (ref 0.00–0.07)
Basophils Absolute: 0.1 10*3/uL (ref 0.0–0.1)
Basophils Relative: 1 %
Eosinophils Absolute: 0.2 10*3/uL (ref 0.0–0.5)
Eosinophils Relative: 2 %
HCT: 45.5 % (ref 39.0–52.0)
Hemoglobin: 15.9 g/dL (ref 13.0–17.0)
Immature Granulocytes: 0 %
Lymphocytes Relative: 37 %
Lymphs Abs: 2.9 10*3/uL (ref 0.7–4.0)
MCH: 32.4 pg (ref 26.0–34.0)
MCHC: 34.9 g/dL (ref 30.0–36.0)
MCV: 92.9 fL (ref 80.0–100.0)
Monocytes Absolute: 0.6 10*3/uL (ref 0.1–1.0)
Monocytes Relative: 8 %
Neutro Abs: 4 10*3/uL (ref 1.7–7.7)
Neutrophils Relative %: 52 %
Platelets: 235 10*3/uL (ref 150–400)
RBC: 4.9 MIL/uL (ref 4.22–5.81)
RDW: 12.7 % (ref 11.5–15.5)
WBC: 7.7 10*3/uL (ref 4.0–10.5)
nRBC: 0 % (ref 0.0–0.2)

## 2021-07-09 LAB — CBG MONITORING, ED: Glucose-Capillary: 108 mg/dL — ABNORMAL HIGH (ref 70–99)

## 2021-07-09 LAB — MAGNESIUM: Magnesium: 2.1 mg/dL (ref 1.7–2.4)

## 2021-07-09 MED ORDER — IBUPROFEN 800 MG PO TABS
800.0000 mg | ORAL_TABLET | Freq: Once | ORAL | Status: AC
  Administered 2021-07-09: 800 mg via ORAL
  Filled 2021-07-09: qty 1

## 2021-07-09 MED ORDER — NAPROXEN 500 MG PO TABS: 500.0000 mg | ORAL_TABLET | Freq: Two times a day (BID) | ORAL | 0 refills | Status: DC

## 2021-07-09 MED ORDER — GABAPENTIN 300 MG PO CAPS: 300.0000 mg | ORAL_CAPSULE | Freq: Two times a day (BID) | ORAL | 0 refills | Status: DC

## 2021-07-09 MED ORDER — GABAPENTIN 300 MG PO CAPS
300.0000 mg | ORAL_CAPSULE | Freq: Once | ORAL | Status: AC
  Administered 2021-07-09: 300 mg via ORAL
  Filled 2021-07-09: qty 1

## 2021-07-09 NOTE — ED Triage Notes (Addendum)
Pt c/o R foot pain that is described as "pins and needles" sensation x 1 day.   Pt does not have a PCP or regular health checks.

## 2021-07-09 NOTE — ED Provider Notes (Signed)
[facility] EMERGENCY DEPARTMENT Provider Note   CSN: [id number] Arrival date & time: 07/09/21  1357    History  Chief Complaint  Patient presents with   Foot Pain    Johnathan Larson is a 41 y.o. male with hx of HTN here for evaluation of right foot pain. Began yesterday. States when he ambulated he feels "pins and needles" to the plantar aspect of foot. When he does not ambulate he has not pain. No pain with ROM, redness, swelling, warmth, leg pain, color change, back pain, back pain, CP, fever, bowel or bladder incontinence saddle anesthesia. No hx of neuropathy, chronic etoh use, DM, known electrolyte abnormality. Does have gabapentin in med rec. No meds PTA  HPI     Home Medications Prior to Admission medications   Medication Sig Start Date End Date Taking? Authorizing Provider  gabapentin (NEURONTIN) 300 MG capsule Take 1 capsule (300 mg total) by mouth 2 (two) times daily for 10 days. 07/09/21 07/19/21 Yes Devonte Migues A, PA-C  naproxen (NAPROSYN) 500 MG tablet Take 1 tablet (500 mg total) by mouth 2 (two) times daily. 07/09/21  Yes Sharonne Ricketts A, PA-C  amoxicillin (AMOXIL) 500 MG capsule Take 2 capsules (1,000 mg total) by mouth 2 (two) times daily. 01/02/18   Melene Plan, DO  chlorhexidine (PERIDEX) 0.12 % solution Use as directed 15 mLs in the mouth or throat 2 (two) times daily. Do NOT swallow. 12/27/17   Bill Salinas, PA-C  HYDROcodone-acetaminophen (NORCO/VICODIN) 5-325 MG tablet Take 1 tablet by mouth every 8 (eight) hours as needed. 02/11/21   Myra Rude, MD  lidocaine (XYLOCAINE) 2 % solution Use as directed 15 mLs in the mouth or throat as needed for mouth pain. Swish and spit.  Do not swallow. 12/11/17   Aviva Kluver B, PA-C  meloxicam (MOBIC) 7.5 MG tablet Take 1 tablet (7.5 mg total) by mouth 2 (two) times daily as needed. 02/11/21   Myra Rude, MD  methocarbamol (ROBAXIN) 500 MG tablet Take 1 tablet (500 mg total) by mouth 2 (two)  times daily. 02/08/21   Smoot, Shawn Route, PA-C  ondansetron (ZOFRAN ODT) 4 MG disintegrating tablet Take 1 tablet (4 mg total) by mouth every 8 (eight) hours as needed for nausea or vomiting. 01/30/18   Ward, Chase Picket, PA-C  ondansetron (ZOFRAN) 4 MG tablet Take 1 tablet (4 mg total) by mouth every 6 (six) hours. 05/05/21   Blue, Soijett A, PA-C  potassium chloride SA (K-DUR,KLOR-CON) 20 MEQ tablet Take 1 tablet (20 mEq total) by mouth 2 (two) times daily for 4 days. 05/18/17 05/22/17  Long, Arlyss Repress, MD      Allergies    Patient has no known allergies.    Review of Systems   Review of Systems  Constitutional: Negative.   HENT: Negative.    Respiratory: Negative.    Cardiovascular: Negative.   Gastrointestinal: Negative.   Genitourinary: Negative.   Musculoskeletal: Negative.        Right plantar foot pain  Skin: Negative.   Neurological:  Positive for numbness (tingling). Negative for dizziness, tremors, seizures, syncope, facial asymmetry, speech difficulty, weakness, light-headedness and headaches.  All other systems reviewed and are negative.  Physical Exam Updated Vital Signs BP (!) 143/104 (BP Location: Left Arm)   Pulse 82   Temp 98.7 F (37.1 C) (Oral)   Resp 20   Ht 5\' 9"  (1.753 m)   Wt 127 kg   SpO2 98%   BMI 41.35  kg/m  Physical Exam Vitals and nursing note reviewed.  Constitutional:      General: He is not in acute distress.    Appearance: He is well-developed. He is not ill-appearing, toxic-appearing or diaphoretic.  HENT:     Head: Normocephalic and atraumatic.     Nose: Nose normal.     Mouth/Throat:     Mouth: Mucous membranes are moist.  Eyes:     Pupils: Pupils are equal, round, and reactive to light.  Cardiovascular:     Rate and Rhythm: Normal rate and regular rhythm.     Pulses: Normal pulses.          Radial pulses are 2+ on the right side and 2+ on the left side.       Dorsalis pedis pulses are 2+ on the right side and 2+ on the left side.        Posterior tibial pulses are 2+ on the right side and 2+ on the left side.  Pulmonary:     Effort: Pulmonary effort is normal. No respiratory distress.     Breath sounds: Normal breath sounds.  Abdominal:     General: Bowel sounds are normal. There is no distension.     Palpations: Abdomen is soft.  Musculoskeletal:        General: Normal range of motion.     Cervical back: Normal range of motion and neck supple.     Comments: Mild tenderness of plantar aspect foot with deep pressure.  Wiggles toe without difficulty.  Full range of motion at ankle, knee, hip.  Compartments soft.  Full range of motion.  Nontender dorsum foot, ankle.  No overlying skin changes. Homans neg. No midline back tenderness, neg straight leg raise bil  Skin:    General: Skin is warm and dry.     Comments: No edema, erythema, warmth, fluctuance or induration, vesicles macerated skin breakdown.  Neurological:     General: No focal deficit present.     Mental Status: He is alert and oriented to person, place, and time.     Cranial Nerves: Cranial nerves 2-12 are intact.     Sensory: Sensation is intact.     Motor: Motor function is intact.     Coordination: Coordination is intact.     Gait: Gait is intact.     Deep Tendon Reflexes: Reflexes are normal and symmetric.     Comments: Intact sensation Equal strength ambulatory   ED Results / Procedures / Treatments   Labs (all labs ordered are listed, but only abnormal results are displayed) Labs Reviewed  CBG MONITORING, ED - Abnormal; Notable for the following components:      Result Value   Glucose-Capillary 108 (*)    All other components within normal limits  CBC WITH DIFFERENTIAL/PLATELET  BASIC METABOLIC PANEL  MAGNESIUM    EKG None  Radiology DG Foot Complete Right  Result Date: 07/09/2021 CLINICAL DATA:  Pain when ambulating EXAM: RIGHT FOOT COMPLETE - 3+ VIEW COMPARISON:  12/29/2016 FINDINGS: No acute fracture or dislocation.The joint spaces  are preserved.Alignment is unremarkable.No significant soft tissue abnormality or foreign body. IMPRESSION: No acute osseous abnormality. Electronically Signed   By: Wiliam Ke M.D.   On: 07/09/2021 15:42    Procedures Procedures    Medications Ordered in ED Medications  gabapentin (NEURONTIN) capsule 300 mg (300 mg Oral Given 07/09/21 1534)  ibuprofen (ADVIL) tablet 800 mg (800 mg Oral Given 07/09/21 1534)    ED Course/ Medical Decision  Making/ A&P    41 year old history of hypertension, low back pain here for evaluation of pins-and-needles sensation to plantar aspect right foot which began yesterday.  Only occurs when he ambulates.  No recent traumatic he has no midline back pain, bowel or bladder, saddle paresthesia to suggest radicular pain from the lower back.  His compartments are soft.  He has no clinical evidence of VTE.  He has equal pulses bilaterally low suspicion for arterial etiology.  He has no erythema, warmth, fluctuance, induration, lacerations or skin breakdown.  Low suspicion for infectious process.  He denies any chronic EtOH use, electrolyte abnormalities or diabetes to suggest neuropathic pain however he does have gabapentin listed in his medical record per chart review.  No chest pain, shortness of breath, back pain, low suspicion for AAA, dissection.  No weakness, other neuro deficits low suspicion for CVA. We will plan on checking x-rays, labs  Labs and imaging personally viewed and interpreted:  DG right foot without significant abnormality CBC without significant abnormality BMP without abnormality Mag 2.1 CBG 108  Patient reassessed.  Discussed labs and imaging. Unclear etiology of intermittent pain. Will treat medically and have him follow-up outpatient with specialist.  He is agreeable.  Based off patient's history, exam, work-up here in emergency department I do not feel patient needs additional labs, imaging, hospitalization at this time.  The patient has  been appropriately medically screened and/or stabilized in the ED. I have low suspicion for any other emergent medical condition which would require further screening, evaluation or treatment in the ED or require inpatient management.  Patient is hemodynamically stable and in no acute distress.  Patient able to ambulate in department prior to ED.  Evaluation does not show acute pathology that would require ongoing or additional emergent interventions while in the emergency department or further inpatient treatment.  I have discussed the diagnosis with the patient and answered all questions.  Pain is been managed while in the emergency department and patient has no further complaints prior to discharge.  Patient is comfortable with plan discussed in room and is stable for discharge at this time.  I have discussed strict return precautions for returning to the emergency department.  Patient was encouraged to follow-up with PCP/specialist refer to at discharge.                           Medical Decision Making Amount and/or Complexity of Data Reviewed External Data Reviewed: labs, radiology, ECG and notes. Labs: ordered. Decision-making details documented in ED Course. Radiology: ordered and independent interpretation performed. Decision-making details documented in ED Course.  Risk OTC drugs. Prescription drug management. Parenteral controlled substances. Decision regarding hospitalization. Diagnosis or treatment significantly limited by social determinants of health.          Final Clinical Impression(s) / ED Diagnoses Final diagnoses:  Tingling    Rx / DC Orders ED Discharge Orders          Ordered    gabapentin (NEURONTIN) 300 MG capsule  2 times daily        07/09/21 1619    naproxen (NAPROSYN) 500 MG tablet  2 times daily        07/09/21 1619              Charlisa Cham A, PA-C 07/09/21 1624    Melene PlanFloyd, Dan, DO 07/12/21 [phone]

## 2021-07-09 NOTE — Discharge Instructions (Addendum)
Take medication as prescribed.  Return for any new or worsening symptoms otherwise follow-up with primary care provider or orthopedics

## 2021-09-30 ENCOUNTER — Encounter (HOSPITAL_BASED_OUTPATIENT_CLINIC_OR_DEPARTMENT_OTHER): Payer: Self-pay

## 2021-09-30 ENCOUNTER — Other Ambulatory Visit (HOSPITAL_BASED_OUTPATIENT_CLINIC_OR_DEPARTMENT_OTHER): Payer: Self-pay

## 2021-09-30 ENCOUNTER — Other Ambulatory Visit: Payer: Self-pay

## 2021-09-30 ENCOUNTER — Emergency Department (HOSPITAL_BASED_OUTPATIENT_CLINIC_OR_DEPARTMENT_OTHER)
Admission: EM | Admit: 2021-09-30 | Discharge: 2021-09-30 | Disposition: A | Discharge status: Home / Self Care | Payer: BC Managed Care – PPO | Attending: Emergency Medicine | Admitting: Emergency Medicine

## 2021-09-30 DIAGNOSIS — L02411 Cutaneous abscess of right axilla: Secondary | ICD-10-CM | POA: Insufficient documentation

## 2021-09-30 DIAGNOSIS — I10 Essential (primary) hypertension: Secondary | ICD-10-CM | POA: Insufficient documentation

## 2021-09-30 MED ORDER — LIDOCAINE-EPINEPHRINE (PF) 2 %-1:200000 IJ SOLN
10.0000 mL | Freq: Once | INTRAMUSCULAR | Status: AC
  Administered 2021-09-30: 10 mL
  Filled 2021-09-30: qty 20

## 2021-09-30 MED ORDER — DOXYCYCLINE HYCLATE 100 MG PO CAPS
100.0000 mg | ORAL_CAPSULE | Freq: Two times a day (BID) | ORAL | 0 refills | Status: AC
  Filled 2021-09-30: qty 20, 10d supply, fill #0

## 2021-09-30 MED ORDER — DOXYCYCLINE HYCLATE 100 MG PO TABS
100.0000 mg | ORAL_TABLET | Freq: Once | ORAL | Status: AC
Start: 2021-09-30 — End: 2021-09-30
  Administered 2021-09-30: 100 mg via ORAL
  Filled 2021-09-30: qty 1

## 2021-09-30 NOTE — ED Triage Notes (Signed)
C/o abscess to right axilla, states opened and wont go away. Hx of abscesses

## 2021-09-30 NOTE — ED Provider Notes (Signed)
[facility] EMERGENCY DEPARTMENT Provider Note   CSN: [id number] Arrival date & time: 09/30/21  1640     History PMH: HTN Chief Complaint  Patient presents with   Abscess    Johnathan Larson is a 41 y.o. male. Presents the ED with right axillary abscess.  He states that it has been present for a couple weeks.  He says that one of them has opened up about 2 weeks ago and started draining, however it healed and and reformed again.  They are very painful.  He denies any fevers or chills.  States in the past he has had similar episodes of abscesses popping up in his right axillary area.  This typically gets better on their own, but this time it has not. No history of hidradenitis suppurativa    Abscess      Home Medications Prior to Admission medications   Medication Sig Start Date End Date Taking? Authorizing Provider  doxycycline (VIBRAMYCIN) 100 MG capsule Take 1 capsule (100 mg total) by mouth 2 (two) times daily for 10 days. 09/30/21 10/10/21 Yes Pamlea Finder, Finis Bud, PA-C  amoxicillin (AMOXIL) 500 MG capsule Take 2 capsules (1,000 mg total) by mouth 2 (two) times daily. 01/02/18   Melene Plan, DO  chlorhexidine (PERIDEX) 0.12 % solution Use as directed 15 mLs in the mouth or throat 2 (two) times daily. Do NOT swallow. 12/27/17   Harlene Salts A, PA-C  gabapentin (NEURONTIN) 300 MG capsule Take 1 capsule (300 mg total) by mouth 2 (two) times daily for 10 days. 07/09/21 07/19/21  Henderly, Britni A, PA-C  HYDROcodone-acetaminophen (NORCO/VICODIN) 5-325 MG tablet Take 1 tablet by mouth every 8 (eight) hours as needed. 02/11/21   Myra Rude, MD  lidocaine (XYLOCAINE) 2 % solution Use as directed 15 mLs in the mouth or throat as needed for mouth pain. Swish and spit.  Do not swallow. 12/11/17   Aviva Kluver B, PA-C  meloxicam (MOBIC) 7.5 MG tablet Take 1 tablet (7.5 mg total) by mouth 2 (two) times daily as needed. 02/11/21   Myra Rude, MD  methocarbamol (ROBAXIN)  500 MG tablet Take 1 tablet (500 mg total) by mouth 2 (two) times daily. 02/08/21   Smoot, Shawn Route, PA-C  naproxen (NAPROSYN) 500 MG tablet Take 1 tablet (500 mg total) by mouth 2 (two) times daily. 07/09/21   Henderly, Britni A, PA-C  ondansetron (ZOFRAN ODT) 4 MG disintegrating tablet Take 1 tablet (4 mg total) by mouth every 8 (eight) hours as needed for nausea or vomiting. 01/30/18   Ward, Chase Picket, PA-C  ondansetron (ZOFRAN) 4 MG tablet Take 1 tablet (4 mg total) by mouth every 6 (six) hours. 05/05/21   Blue, Soijett A, PA-C  potassium chloride SA (K-DUR,KLOR-CON) 20 MEQ tablet Take 1 tablet (20 mEq total) by mouth 2 (two) times daily for 4 days. 05/18/17 05/22/17  Long, Arlyss Repress, MD      Allergies    Patient has no known allergies.    Review of Systems   Review of Systems  Skin:        absces  All other systems reviewed and are negative.   Physical Exam Updated Vital Signs BP (!) 157/99 (BP Location: Left Arm)   Pulse 76   Temp 98.1 F (36.7 C) (Oral)   Resp 16   Ht 5\' 9"  (1.753 m)   Wt 124.7 kg   SpO2 95%   BMI 40.61 kg/m  Physical Exam Vitals and nursing note reviewed.  Constitutional:      General: He is not in acute distress.    Appearance: Normal appearance. He is well-developed. He is not ill-appearing, toxic-appearing or diaphoretic.  HENT:     Head: Normocephalic and atraumatic.     Nose: No nasal deformity.     Mouth/Throat:     Lips: Pink. No lesions.  Eyes:     General: Gaze aligned appropriately. No scleral icterus.       Right eye: No discharge.        Left eye: No discharge.     Conjunctiva/sclera: Conjunctivae normal.     Right eye: Right conjunctiva is not injected. No exudate or hemorrhage.    Left eye: Left conjunctiva is not injected. No exudate or hemorrhage. Pulmonary:     Effort: Pulmonary effort is normal. No respiratory distress.  Skin:    General: Skin is warm and dry.     Comments: Several fluctuant area in right axilla. Tender to the  touch. Not erythematous. Not draining. Surrounding lymphadenopathy  Neurological:     Mental Status: He is alert and oriented to person, place, and time.  Psychiatric:        Mood and Affect: Mood normal.        Speech: Speech normal.        Behavior: Behavior normal. Behavior is cooperative.     ED Results / Procedures / Treatments   Labs (all labs ordered are listed, but only abnormal results are displayed) Labs Reviewed - No data to display  EKG None  Radiology No results found.  Procedures .Marland KitchenIncision and Drainage  Date/Time: 09/30/2021 5:55 PM  Performed by: Claudie Leach, PA-C Authorized by: Claudie Leach, PA-C   Consent:    Consent obtained:  Verbal   Consent given by:  Patient   Risks, benefits, and alternatives were discussed: yes     Risks discussed:  Bleeding, damage to other organs, infection, incomplete drainage and pain   Alternatives discussed:  No treatment and alternative treatment Universal protocol:    Procedure explained and questions answered to patient or proxy's satisfaction: yes     Patient identity confirmed:  Verbally with patient Location:    Type:  Abscess   Size:  2   Location: right axillary. Pre-procedure details:    Skin preparation:  Povidone-iodine Sedation:    Sedation type:  None Anesthesia:    Anesthesia method:  Local infiltration   Local anesthetic:  Lidocaine 2% WITH epi Procedure type:    Complexity:  Complex Procedure details:    Incision types:  Single straight   Incision depth:  Dermal   Wound management:  Probed and deloculated   Drainage:  Bloody and purulent   Drainage amount:  Moderate   Wound treatment:  Wound left open   Packing materials:  None Post-procedure details:    Procedure completion:  Tolerated    Medications Ordered in ED Medications  doxycycline (VIBRA-TABS) tablet 100 mg (has no administration in time range)  lidocaine-EPINEPHrine (XYLOCAINE W/EPI) 2 %-1:200000 (PF) injection 10 mL  (10 mLs Infiltration Given 09/30/21 1720)    ED Course/ Medical Decision Making/ A&P                           Medical Decision Making Risk Prescription drug management.   Patient here with multiple right axillary abscess. Vitals stable here. Appears well. No systemic symptoms. Associated lymphadenopathy noted. I and D performed. Some purulent material, but  mostly bloody drainage. I have placed on Doxycycline. Suspect underlying hydradenitis suppurativa diagnosis. Will have follow up with community clinic for further evaluation. Stable for discharge.   Final Clinical Impression(s) / ED Diagnoses Final diagnoses:  Abscess of axilla, right    Rx / DC Orders ED Discharge Orders          Ordered    doxycycline (VIBRAMYCIN) 100 MG capsule  2 times daily        09/30/21 1753              Claudie Leach, PA-C 09/30/21 Jossie Ng, MD 09/30/21 2310

## 2021-09-30 NOTE — ED Notes (Signed)
Alert x 4 Abcess under rt arm some swelling . Full movement

## 2021-09-30 NOTE — Discharge Instructions (Addendum)
Please schedule an appointment with [facility] to establish care and talk about your frequent abscess condition

## 2021-10-01 ENCOUNTER — Other Ambulatory Visit (HOSPITAL_BASED_OUTPATIENT_CLINIC_OR_DEPARTMENT_OTHER): Payer: Self-pay

## 2022-06-08 ENCOUNTER — Encounter: Payer: Self-pay | Admitting: *Deleted

## 2023-09-05 ENCOUNTER — Emergency Department (HOSPITAL_BASED_OUTPATIENT_CLINIC_OR_DEPARTMENT_OTHER)
Admission: EM | Admit: 2023-09-05 | Discharge: 2023-09-05 | Disposition: A | Discharge status: Home / Self Care | Payer: Self-pay | Attending: Emergency Medicine | Admitting: Emergency Medicine

## 2023-09-05 ENCOUNTER — Encounter (HOSPITAL_BASED_OUTPATIENT_CLINIC_OR_DEPARTMENT_OTHER): Payer: Self-pay

## 2023-09-05 ENCOUNTER — Other Ambulatory Visit: Payer: Self-pay

## 2023-09-05 DIAGNOSIS — I10 Essential (primary) hypertension: Secondary | ICD-10-CM | POA: Insufficient documentation

## 2023-09-05 DIAGNOSIS — L02412 Cutaneous abscess of left axilla: Secondary | ICD-10-CM | POA: Diagnosis present

## 2023-09-05 MED ORDER — DOXYCYCLINE HYCLATE 100 MG PO CAPS: 100.0000 mg | ORAL_CAPSULE | Freq: Two times a day (BID) | ORAL | 0 refills | Status: DC

## 2023-09-05 MED ORDER — LIDOCAINE-EPINEPHRINE (PF) 2 %-1:200000 IJ SOLN
10.0000 mL | Freq: Once | INTRAMUSCULAR | Status: AC
  Administered 2023-09-05: 10 mL
  Filled 2023-09-05: qty 20

## 2023-09-05 NOTE — ED Triage Notes (Signed)
 Pt reports abscess to left axilla for the last couple days. Pt reports hx of same.

## 2023-09-05 NOTE — Discharge Instructions (Addendum)
 I would keep a bandage or some gauze over the area to allow it to drain.  Please take the entire course of antibiotics that I prescribed.  Return in 1 week for suture removal as we discussed.  Please use Tylenol  or ibuprofen  for pain.  You may use 600 mg ibuprofen  every 6 hours or 1000 mg of Tylenol  every 6 hours.  You may choose to alternate between the 2.  This would be most effective.  Not to exceed 4 g of Tylenol  within 24 hours.  Not to exceed 3200 mg ibuprofen  24 hours.

## 2023-09-05 NOTE — ED Provider Notes (Signed)
 [place] EMERGENCY DEPARTMENT AT [facility] Provider Note   CSN: [id number] Arrival date & time: 09/05/23  1819     Patient presents with: Abscess   Johnathan Larson is a 43 y.o. male with past medical history significant for hypertension who presents concern for abscess to left axilla for the last few days.  He has had history of same.  He rates the pain 8/10.  No formal diagnosis of hidradenitis in the past.    Abscess      Prior to Admission medications   Medication Sig Start Date End Date Taking? Authorizing Provider  doxycycline  (VIBRAMYCIN ) 100 MG capsule Take 1 capsule (100 mg total) by mouth 2 (two) times daily. 09/05/23  Yes Lois Ostrom H, PA-C  amoxicillin  (AMOXIL ) 500 MG capsule Take 2 capsules (1,000 mg total) by mouth 2 (two) times daily. 01/02/18   Emil Share, DO  chlorhexidine  (PERIDEX ) 0.12 % solution Use as directed 15 mLs in the mouth or throat 2 (two) times daily. Do NOT swallow. 12/27/17   Donah Riis A, PA-C  gabapentin  (NEURONTIN ) 300 MG capsule Take 1 capsule (300 mg total) by mouth 2 (two) times daily for 10 days. 07/09/21 07/19/21  Henderly, Britni A, PA-C  HYDROcodone -acetaminophen  (NORCO/VICODIN) 5-325 MG tablet Take 1 tablet by mouth every 8 (eight) hours as needed. 02/11/21   Chick Venetia BRAVO, MD  lidocaine  (XYLOCAINE ) 2 % solution Use as directed 15 mLs in the mouth or throat as needed for mouth pain. Swish and spit.  Do not swallow. 12/11/17   Murray, Alyssa B, PA-C  meloxicam  (MOBIC ) 7.5 MG tablet Take 1 tablet (7.5 mg total) by mouth 2 (two) times daily as needed. 02/11/21   Chick Venetia BRAVO, MD  methocarbamol  (ROBAXIN ) 500 MG tablet Take 1 tablet (500 mg total) by mouth 2 (two) times daily. 02/08/21   Smoot, Lauraine LABOR, PA-C  naproxen  (NAPROSYN ) 500 MG tablet Take 1 tablet (500 mg total) by mouth 2 (two) times daily. 07/09/21   Henderly, Britni A, PA-C  ondansetron  (ZOFRAN  ODT) 4 MG disintegrating tablet Take 1 tablet (4 mg total)  by mouth every 8 (eight) hours as needed for nausea or vomiting. 01/30/18   Ward, Ami Copes, PA-C  ondansetron  (ZOFRAN ) 4 MG tablet Take 1 tablet (4 mg total) by mouth every 6 (six) hours. 05/05/21   Blue, Soijett A, PA-C  potassium chloride  SA (K-DUR,KLOR-CON ) 20 MEQ tablet Take 1 tablet (20 mEq total) by mouth 2 (two) times daily for 4 days. 05/18/17 05/22/17  Long, Joshua G, MD    Allergies: Patient has no known allergies.    Review of Systems  All other systems reviewed and are negative.   Updated Vital Signs BP (!) 157/99   Pulse 70   Temp 98.1 F (36.7 C)   Resp 18   Ht 5' 9 (1.753 m)   SpO2 98%   BMI 40.61 kg/m   Physical Exam Vitals and nursing note reviewed.  Constitutional:      General: He is not in acute distress.    Appearance: Normal appearance.  HENT:     Head: Normocephalic and atraumatic.  Eyes:     General:        Right eye: No discharge.        Left eye: No discharge.  Cardiovascular:     Rate and Rhythm: Normal rate and regular rhythm.     Heart sounds: No murmur heard.    No friction rub. No gallop.  Pulmonary:  Effort: Pulmonary effort is normal.     Breath sounds: Normal breath sounds.  Abdominal:     General: Bowel sounds are normal.     Palpations: Abdomen is soft.  Skin:    General: Skin is warm and dry.     Capillary Refill: Capillary refill takes less than 2 seconds.     Comments: Patient with abscess under left axilla with surrounding induration, redness, quite tender to palpation throughout.  Neurological:     Mental Status: He is alert and oriented to person, place, and time.  Psychiatric:        Mood and Affect: Mood normal.        Behavior: Behavior normal.     (all labs ordered are listed, but only abnormal results are displayed) Labs Reviewed - No data to display  EKG: None  Radiology: No results found.   .Incision and Drainage  Date/Time: 09/05/2023 8:21 PM  Performed by: Rosan Sherlean DEL,  PA-C Authorized by: Rosan Sherlean DEL, PA-C   Consent:    Consent obtained:  Verbal   Consent given by:  Patient   Risks, benefits, and alternatives were discussed: yes     Risks discussed:  Bleeding, infection, pain, incomplete drainage and damage to other organs   Alternatives discussed:  No treatment Universal protocol:    Procedure explained and questions answered to patient or proxy's satisfaction: yes     Patient identity confirmed:  Verbally with patient Location:    Type:  Abscess   Size:  3x6cm   Location:  Upper extremity   Upper extremity location: left axilla. Pre-procedure details:    Skin preparation:  Povidone-iodine Anesthesia:    Anesthesia method:  Local infiltration   Local anesthetic:  Lidocaine  2% WITH epi Procedure type:    Complexity:  Simple Procedure details:    Incision types:  Single straight   Incision depth:  Dermal   Wound management:  Probed and deloculated   Drainage:  Bloody and purulent   Drainage amount:  Moderate   Wound treatment: 1 suture placed.   Packing materials:  None Post-procedure details:    Procedure completion:  Tolerated    Medications Ordered in the ED  lidocaine -EPINEPHrine  (XYLOCAINE  W/EPI) 2 %-1:200000 (PF) injection 10 mL (10 mLs Infiltration Given by Other 09/05/23 2002)                                    Medical Decision Making Risk Prescription drug management.   This patient is a 43 y.o. male who presents to the ED for concern of abscess of left axilla.   Differential diagnoses prior to evaluation: Abscess, cellulitis, vs other  Past Medical History / Social History / Additional history: Chart reviewed. Pertinent results include: Overall unremarkable  Physical Exam: Physical exam performed. The pertinent findings include: Mild hypertension, blood pressure 157/99, vital signs otherwise stable.   Patient with abscess under left axilla with surrounding induration, redness, quite tender to palpation  throughout.   Medications / Treatment: Incision and drainage performed as described above, discharged on doxycycline , instructed to return in 1 week (no longer than 2 weeks) for wound check and suture removal.   Disposition: After consideration of the diagnostic results and the patients response to treatment, I feel that patient is stable for discharge after abscess drainage as discussed above.   emergency department workup does not suggest an emergent condition requiring admission or immediate intervention  beyond what has been performed at this time. The plan is: as above. The patient is safe for discharge and has been instructed to return immediately for worsening symptoms, change in symptoms or any other concerns.   Final diagnoses:  Abscess of left axilla    ED Discharge Orders          Ordered    doxycycline  (VIBRAMYCIN ) 100 MG capsule  2 times daily        09/05/23 2020               Kristie Bracewell, Sherlean DEL, PA-C 09/05/23 2023    Elnor Bernarda SQUIBB, DO 09/07/23 1535

## 2023-10-19 ENCOUNTER — Emergency Department (HOSPITAL_BASED_OUTPATIENT_CLINIC_OR_DEPARTMENT_OTHER)
Admission: EM | Admit: 2023-10-19 | Discharge: 2023-10-19 | Disposition: A | Discharge status: Home / Self Care | Attending: Emergency Medicine | Admitting: Emergency Medicine

## 2023-10-19 ENCOUNTER — Other Ambulatory Visit (HOSPITAL_BASED_OUTPATIENT_CLINIC_OR_DEPARTMENT_OTHER): Payer: Self-pay

## 2023-10-19 ENCOUNTER — Encounter (HOSPITAL_BASED_OUTPATIENT_CLINIC_OR_DEPARTMENT_OTHER): Payer: Self-pay | Admitting: Emergency Medicine

## 2023-10-19 ENCOUNTER — Other Ambulatory Visit: Payer: Self-pay

## 2023-10-19 DIAGNOSIS — F17210 Nicotine dependence, cigarettes, uncomplicated: Secondary | ICD-10-CM | POA: Insufficient documentation

## 2023-10-19 DIAGNOSIS — I10 Essential (primary) hypertension: Secondary | ICD-10-CM | POA: Insufficient documentation

## 2023-10-19 DIAGNOSIS — K0889 Other specified disorders of teeth and supporting structures: Secondary | ICD-10-CM | POA: Diagnosis present

## 2023-10-19 MED ORDER — CHLORHEXIDINE GLUCONATE 0.12 % MT SOLN
15.0000 mL | Freq: Two times a day (BID) | OROMUCOSAL | 0 refills | Status: AC
  Filled 2023-10-19: qty 473, 16d supply, fill #0

## 2023-10-19 MED ORDER — AMOXICILLIN-POT CLAVULANATE 875-125 MG PO TABS
1.0000 | ORAL_TABLET | Freq: Two times a day (BID) | ORAL | 0 refills | Status: AC
  Filled 2023-10-19: qty 20, 10d supply, fill #0

## 2023-10-19 MED ORDER — CELECOXIB 200 MG PO CAPS
200.0000 mg | ORAL_CAPSULE | Freq: Two times a day (BID) | ORAL | 0 refills | Status: AC | PRN
  Filled 2023-10-19: qty 30, 15d supply, fill #0

## 2023-10-19 NOTE — ED Provider Notes (Signed)
 [place] EMERGENCY DEPARTMENT AT [facility] Provider Note   CSN: [id number] Arrival date & time: 10/19/23  1523     Patient presents with: Dental Pain   Johnathan Larson is a 43 y.o. male.    Dental Pain   43 year old male presents emergency department plaints of dental pain.  Reports pain right upper molar.  Reports known fractured tooth area as well as multiple dental caries.  Does state he has appointment with a dentist coming up but was unable to get in more quickly.  Was told to be seen for assessment for possible antibiotics.  Denies any fever, difficulty breathing, floor of mouth swelling, difficulty swallowing.  Past medical history significant for hypertension  Prior to Admission medications   Medication Sig Start Date End Date Taking? Authorizing Provider  amoxicillin -clavulanate (AUGMENTIN ) 875-125 MG tablet Take 1 tablet by mouth every 12 (twelve) hours. 10/19/23  Yes Silver Fell A, PA  celecoxib  (CELEBREX ) 200 MG capsule Take 1 capsule (200 mg total) by mouth 2 (two) times daily as needed. 10/19/23  Yes Silver Fell A, PA  chlorhexidine  (PERIDEX ) 0.12 % solution Use as directed 15 mLs in the mouth or throat 2 (two) times daily. 10/19/23  Yes Silver Fell LABOR, PA    Allergies: Patient has no known allergies.    Review of Systems  All other systems reviewed and are negative.   Updated Vital Signs BP (!) 168/97 (BP Location: Right Arm)   Pulse (!) 58   Temp 98.6 F (37 C) (Oral)   Resp 20   Ht 5' 9 (1.753 m)   Wt 124.7 kg   SpO2 98%   BMI 40.61 kg/m   Physical Exam Vitals and nursing note reviewed.  Constitutional:      General: He is not in acute distress.    Appearance: He is well-developed.  HENT:     Head: Normocephalic and atraumatic.     Mouth/Throat:      Comments: Numerous carious teeth.  Tenderness along gingiva on molars as above.  No evidence clinically periapical abscess.  Uvula midline there is symmetric with phonation.   No sublingual extremity or lip swelling.  No trismus.  Normal phonation per patient. Eyes:     Conjunctiva/sclera: Conjunctivae normal.  Cardiovascular:     Rate and Rhythm: Normal rate and regular rhythm.     Heart sounds: No murmur heard. Pulmonary:     Effort: Pulmonary effort is normal. No respiratory distress.     Breath sounds: Normal breath sounds.  Abdominal:     Palpations: Abdomen is soft.     Tenderness: There is no abdominal tenderness.  Musculoskeletal:        General: No swelling.     Cervical back: Neck supple.  Skin:    General: Skin is warm and dry.     Capillary Refill: Capillary refill takes less than 2 seconds.  Neurological:     Mental Status: He is alert.  Psychiatric:        Mood and Affect: Mood normal.     (all labs ordered are listed, but only abnormal results are displayed) Labs Reviewed - No data to display  EKG: None  Radiology: No results found.   Procedures   Medications Ordered in the ED - No data to display                                  Medical  Decision Making Risk Prescription drug management.   This patient presents to the ED for concern of dental pain, this involves an extensive number of treatment options, and is a complaint that carries with it a high risk of complications and morbidity.  The differential diagnosis includes abscess, Ludwig angina, Lemierre's disease, necrotizing ulcerative gingivitis, other   Co morbidities that complicate the patient evaluation  See HPI   Additional history obtained:  Additional history obtained from EMR External records from outside source obtained and reviewed including hospital records   Lab Tests:  N/a   Imaging Studies ordered:  N/a   Cardiac Monitoring: / EKG:  N/a   Consultations Obtained:  N/a   Problem List / ED Course / Critical interventions / Medication management  Dental pain Reevaluation of the patien showed that the patient stayed the same I  have reviewed the patients home medicines and have made adjustments as needed   Social Determinants of Health:  Chronic cigarette use.  Denies illicit drug use.   Test / Admission - Considered:  Dental pain Vitals signs significant for hypertension. Otherwise within normal range and stable throughout visit.  43 year old male presents emergency department plaints of dental pain.  Reports pain right upper molar.  Reports known fractured tooth area as well as multiple dental caries.  Does state he has appointment with a dentist coming up but was unable to get in more quickly.  Was told to be seen for assessment for possible antibiotics.  Denies any fever, difficulty breathing, floor of mouth swelling, difficulty swallowing. On exam, numerous carious teeth.  No evidence clinically of periapical, peritonsillar abscess.  No evidence of Ludwig angina.  Gingival tenderness as above along right upper molars.  Which empirically with antibiotics given concern for infection.  Will recommend proper hygiene, follow-up with dental specialist in outpatient setting.  Treatment plan discussed with patient teaching understanding was agreeable.  Patient well-appearing, afebrile in no acute distress upon discharge. Worrisome signs and symptoms were discussed with the patient, and the patient acknowledged understanding to return to the ED if noticed. Patient was stable upon discharge.       Final diagnoses:  Pain, dental    ED Discharge Orders          Ordered    amoxicillin -clavulanate (AUGMENTIN ) 875-125 MG tablet  Every 12 hours        10/19/23 1619    chlorhexidine  (PERIDEX ) 0.12 % solution  2 times daily       Note to Pharmacy: 1 bottle   10/19/23 1619    celecoxib  (CELEBREX ) 200 MG capsule  2 times daily PRN        10/19/23 1619               Silver Wonda LABOR, PA 10/19/23 1640    Tegeler, Lonni PARAS, MD 10/19/23 2036

## 2023-10-19 NOTE — Discharge Instructions (Signed)
 We are placing antibiotics give concern for infection.  Will also send in mouthwash and medicine for pain/inflammation.  Recommend follow-up with dental specialist have affected teeth addressed.

## 2023-10-19 NOTE — ED Triage Notes (Signed)
 Pt c/o R upper and lower dental pain x 2 days.  Denies fever.
